# Patient Record
Sex: Female | Born: 1955 | Race: White | Hispanic: No | Smoking: Never smoker
Health system: Southern US, Community
[De-identification: ages and names within clinical notes are randomized; demographics above are authoritative.]

## PROBLEM LIST (undated history)

## (undated) DIAGNOSIS — F209 Schizophrenia, unspecified: Secondary | ICD-10-CM

## (undated) DIAGNOSIS — W57XXXA Bitten or stung by nonvenomous insect and other nonvenomous arthropods, initial encounter: Secondary | ICD-10-CM

## (undated) DIAGNOSIS — R51 Headache: Secondary | ICD-10-CM

## (undated) DIAGNOSIS — K219 Gastro-esophageal reflux disease without esophagitis: Secondary | ICD-10-CM

## (undated) DIAGNOSIS — E282 Polycystic ovarian syndrome: Secondary | ICD-10-CM

## (undated) DIAGNOSIS — F329 Major depressive disorder, single episode, unspecified: Secondary | ICD-10-CM

## (undated) DIAGNOSIS — R519 Headache, unspecified: Secondary | ICD-10-CM

## (undated) DIAGNOSIS — F32A Depression, unspecified: Secondary | ICD-10-CM

## (undated) DIAGNOSIS — Z800 Family history of malignant neoplasm of digestive organs: Secondary | ICD-10-CM

## (undated) DIAGNOSIS — J45909 Unspecified asthma, uncomplicated: Secondary | ICD-10-CM

## (undated) DIAGNOSIS — M1990 Unspecified osteoarthritis, unspecified site: Secondary | ICD-10-CM

## (undated) HISTORY — DX: Family history of malignant neoplasm of digestive organs: Z80.0

## (undated) HISTORY — PX: DILATION AND CURETTAGE OF UTERUS: SHX78

## (undated) HISTORY — PX: NOSE SURGERY: SHX723

---

## 1984-12-30 HISTORY — PX: OVARIAN CYST REMOVAL: SHX89

## 1985-12-30 HISTORY — PX: DIAGNOSTIC LAPAROSCOPY: SUR761

## 2006-03-22 ENCOUNTER — Emergency Department: Payer: Self-pay | Admitting: Emergency Medicine

## 2006-09-18 ENCOUNTER — Ambulatory Visit: Payer: Self-pay | Admitting: Internal Medicine

## 2006-11-04 ENCOUNTER — Ambulatory Visit: Payer: Self-pay | Admitting: Internal Medicine

## 2008-06-20 ENCOUNTER — Ambulatory Visit: Payer: Self-pay | Admitting: Internal Medicine

## 2008-08-25 ENCOUNTER — Ambulatory Visit: Payer: Self-pay

## 2008-09-06 ENCOUNTER — Ambulatory Visit: Payer: Self-pay

## 2008-09-06 LAB — HM MAMMOGRAPHY

## 2009-01-05 ENCOUNTER — Ambulatory Visit: Payer: Self-pay | Admitting: Unknown Physician Specialty

## 2009-09-07 ENCOUNTER — Ambulatory Visit: Payer: Self-pay | Admitting: Internal Medicine

## 2009-09-08 ENCOUNTER — Ambulatory Visit: Payer: Self-pay | Admitting: Internal Medicine

## 2010-06-04 ENCOUNTER — Ambulatory Visit: Payer: Self-pay | Admitting: Internal Medicine

## 2010-06-26 ENCOUNTER — Ambulatory Visit: Payer: Self-pay | Admitting: Surgery

## 2010-08-29 ENCOUNTER — Ambulatory Visit: Payer: Self-pay | Admitting: Unknown Physician Specialty

## 2010-08-31 LAB — PATHOLOGY REPORT

## 2010-11-15 ENCOUNTER — Ambulatory Visit: Payer: Self-pay | Admitting: Internal Medicine

## 2011-01-07 ENCOUNTER — Emergency Department: Payer: Self-pay | Admitting: Emergency Medicine

## 2011-01-15 ENCOUNTER — Emergency Department: Payer: Self-pay | Admitting: Emergency Medicine

## 2012-02-18 ENCOUNTER — Ambulatory Visit: Payer: Self-pay | Admitting: Internal Medicine

## 2013-05-13 ENCOUNTER — Ambulatory Visit: Payer: Self-pay | Admitting: Unknown Physician Specialty

## 2013-05-14 LAB — PATHOLOGY REPORT

## 2013-07-22 ENCOUNTER — Ambulatory Visit: Payer: Self-pay | Admitting: Internal Medicine

## 2014-08-22 DIAGNOSIS — E669 Obesity, unspecified: Secondary | ICD-10-CM | POA: Insufficient documentation

## 2014-08-22 DIAGNOSIS — F250 Schizoaffective disorder, bipolar type: Secondary | ICD-10-CM | POA: Insufficient documentation

## 2014-08-22 DIAGNOSIS — E282 Polycystic ovarian syndrome: Secondary | ICD-10-CM | POA: Insufficient documentation

## 2014-09-22 ENCOUNTER — Ambulatory Visit: Payer: Self-pay | Admitting: Internal Medicine

## 2015-01-16 DIAGNOSIS — F209 Schizophrenia, unspecified: Secondary | ICD-10-CM | POA: Diagnosis not present

## 2015-02-15 DIAGNOSIS — Z01419 Encounter for gynecological examination (general) (routine) without abnormal findings: Secondary | ICD-10-CM | POA: Diagnosis not present

## 2015-02-15 LAB — HM PAP SMEAR: HM Pap smear: NEGATIVE

## 2015-02-28 DIAGNOSIS — F209 Schizophrenia, unspecified: Secondary | ICD-10-CM | POA: Diagnosis not present

## 2015-06-23 DIAGNOSIS — Z0100 Encounter for examination of eyes and vision without abnormal findings: Secondary | ICD-10-CM | POA: Diagnosis not present

## 2015-06-27 DIAGNOSIS — F209 Schizophrenia, unspecified: Secondary | ICD-10-CM | POA: Diagnosis not present

## 2015-07-25 DIAGNOSIS — F209 Schizophrenia, unspecified: Secondary | ICD-10-CM | POA: Diagnosis not present

## 2015-07-26 ENCOUNTER — Encounter: Payer: Self-pay | Admitting: *Deleted

## 2015-07-26 ENCOUNTER — Emergency Department
Admission: EM | Admit: 2015-07-26 | Discharge: 2015-07-26 | Disposition: A | Discharge status: Home / Self Care | Payer: Commercial Managed Care - HMO | Attending: Emergency Medicine | Admitting: Emergency Medicine

## 2015-07-26 DIAGNOSIS — T887XXA Unspecified adverse effect of drug or medicament, initial encounter: Secondary | ICD-10-CM | POA: Diagnosis not present

## 2015-07-26 DIAGNOSIS — T50905A Adverse effect of unspecified drugs, medicaments and biological substances, initial encounter: Secondary | ICD-10-CM

## 2015-07-26 DIAGNOSIS — T421X5A Adverse effect of iminostilbenes, initial encounter: Secondary | ICD-10-CM | POA: Insufficient documentation

## 2015-07-26 DIAGNOSIS — R220 Localized swelling, mass and lump, head: Secondary | ICD-10-CM | POA: Diagnosis not present

## 2015-07-26 HISTORY — DX: Depression, unspecified: F32.A

## 2015-07-26 HISTORY — DX: Schizophrenia, unspecified: F20.9

## 2015-07-26 HISTORY — DX: Major depressive disorder, single episode, unspecified: F32.9

## 2015-07-26 MED ORDER — PREDNISONE 50 MG PO TABS: ORAL_TABLET | ORAL | Status: DC

## 2015-07-26 MED ORDER — PREDNISONE 20 MG PO TABS
60.0000 mg | ORAL_TABLET | Freq: Once | ORAL | Status: AC
  Administered 2015-07-26: 60 mg via ORAL
  Filled 2015-07-26: qty 3

## 2015-07-26 NOTE — ED Provider Notes (Signed)
[facility] Emergency Department Provider Note     Time seen: ----------------------------------------- 5:15 PM on 07/26/2015 -----------------------------------------    I have reviewed the triage vital signs and the nursing notes.   HISTORY  Chief Complaint Allergic Reaction    HPI Samantha Dean is a 59 y.o. female who presents to ER with tongue swelling and lip swelling. Patient states today she feels a burning sensation in the back for. She was recently started on Trileptal, has a history of depression and schizophrenia. Currently the symptoms are mild, nothing makes it better or worse. She was told by the nurse for her psychiatrist come to the ER for evaluation   Past Medical History  Diagnosis Date  . Depression   . Schizophrenia     There are no active problems to display for this patient.   Past Surgical History  Procedure Laterality Date  . Cesarean section      Allergies Sulfa antibiotics  Social History History  Substance Use Topics  . Smoking status: Never Smoker   . Smokeless tobacco: Not on file  . Alcohol Use: No    Review of Systems Constitutional: Negative for fever. Eyes: Negative for visual changes. ENT: Positive for throat, tongue, lip swelling or burning Cardiovascular: Negative for chest pain. Respiratory: Negative for shortness of breath. Gastrointestinal: Negative for abdominal pain, vomiting and diarrhea. Genitourinary: Negative for dysuria. Musculoskeletal: Negative for back pain. Skin: Negative for rash. Neurological: Negative for headaches, focal weakness or numbness.  10-point ROS otherwise negative.  ____________________________________________   PHYSICAL EXAM:  VITAL SIGNS: ED Triage Vitals  Enc Vitals Group     BP 07/26/15 1659 126/76 mmHg     Pulse Rate 07/26/15 1659 89     Resp 07/26/15 1659 16     Temp 07/26/15 1659 98.2 F (36.8 C)     Temp Source 07/26/15 1659 Oral     SpO2  07/26/15 1659 98 %     Weight 07/26/15 1659 168 lb (76.204 kg)     Height 07/26/15 1659 5' (1.524 m)     Head Cir --      Peak Flow --      Pain Score 07/26/15 1705 8     Pain Loc --      Pain Edu? --      Excl. in [place]? --     Constitutional: Alert and oriented. Well appearing and in no distress. Eyes: Conjunctivae are normal. PERRL. Normal extraocular movements. ENT   Head: Normocephalic and atraumatic.   Nose: No congestion/rhinnorhea.   Mouth/Throat: Mucous membranes are moist.   Neck: No stridor. Hematological/Lymphatic/Immunilogical: No cervical lymphadenopathy. Cardiovascular: Normal rate, regular rhythm. Normal and symmetric distal pulses are present in all extremities. No murmurs, rubs, or gallops. Respiratory: Normal respiratory effort without tachypnea nor retractions. Breath sounds are clear and equal bilaterally. No wheezes/rales/rhonchi. Gastrointestinal: Soft and nontender. No distention. No abdominal bruits. There is no CVA tenderness. Musculoskeletal: Nontender with normal range of motion in all extremities. No joint effusions.  No lower extremity tenderness nor edema. Neurologic:  Normal speech and language. No gross focal neurologic deficits are appreciated. Speech is normal. No gait instability. Skin:  Skin is warm, dry and intact. No rash noted. Psychiatric: Patient exhibits appropriate insight and judgment.  ____________________________________________  ED COURSE:  Pertinent labs & imaging results that were available during my care of the patient were reviewed by me and considered in my medical decision making (see chart for details). Patient looks well, will  give oral prednisone. She is stable for outpatient follow-up area and advised stopping the Trileptal ____________________________________________    ____________________________________________  FINAL ASSESSMENT AND PLAN  Possible medication reaction  Plan: Patient with labs and imaging  as dictated above. Patient looks well, no visible signs of swelling or lesions noted. She stable for discharge. Will continue prednisone for several days.   Earleen Newport, MD   Earleen Newport, MD 07/26/15 [phone]

## 2015-07-26 NOTE — ED Notes (Signed)
Pt reports that 1.5 days ago she had tongue swelling and bottom lip swelling. Today, she feels a burning sensation in the back of her throat. Pt in no acute distress in triage.

## 2015-07-26 NOTE — Discharge Instructions (Signed)
Drug Allergy °A drug allergy means you have a strange reaction to a medicine. You may have puffiness (swelling), itching, red rashes, and hives. Some allergic reactions can be life-threatening. °HOME CARE  °If you do not know what caused your reaction: °· Write down medicines you use. °· Write down any problems you have after using medicine. °· Avoid things that cause a reaction. °· You can see an allergy doctor to be tested for allergies. °If you have hives or a rash: °· Take medicine as told by your doctor. °· Place cold cloths on your skin. °· Do not take hot baths or hot showers. Take baths in cool water. °If you are severely allergic: °· Wear a medical bracelet or necklace that lists your allergy. °· Carry your allergy kit or medicine shot to treat severe allergic reactions with you. These can save your life. °· Do not drive until medicine from your shot has worn off, unless your doctor says it is okay. °GET HELP RIGHT AWAY IF:  °· Your mouth is puffy, or you have trouble breathing. °· You have a tight feeling in your chest or throat. °· You have hives, puffiness, or itching all over your body. °· You throw up (vomit) or have watery poop (diarrhea). °· You feel dizzy or pass out (faint). °· You think you are having a reaction. Problems often start within 30 minutes after taking a medicine. °· You are getting worse, not better. °· You have new problems. °· Your problems go away and then come back. °This is an emergency. Use your medicine shot or allergy kit as told. Call your local emergency services (911 in [place] after the shot. Even if you feel better after the shot, you need to go to the hospital. You may need more medicine to control a severe reaction. °MAKE SURE YOU: °· Understand these instructions. °· Will watch your condition. °· Will get help right away if you are not doing well or get worse. °Document Released: 01/23/2005 Document Revised: 03/09/2012 Document Reviewed: 06/13/2011 °ExitCare® Patient  Information ©2015 [employer]. This information is not intended to replace advice given to you by your health care provider. Make sure you discuss any questions you have with your health care provider. ° °

## 2015-07-31 DIAGNOSIS — J309 Allergic rhinitis, unspecified: Secondary | ICD-10-CM | POA: Diagnosis not present

## 2015-07-31 DIAGNOSIS — J029 Acute pharyngitis, unspecified: Secondary | ICD-10-CM | POA: Diagnosis not present

## 2015-08-21 DIAGNOSIS — J019 Acute sinusitis, unspecified: Secondary | ICD-10-CM | POA: Diagnosis not present

## 2015-08-21 DIAGNOSIS — B9689 Other specified bacterial agents as the cause of diseases classified elsewhere: Secondary | ICD-10-CM | POA: Diagnosis not present

## 2015-09-08 DIAGNOSIS — J029 Acute pharyngitis, unspecified: Secondary | ICD-10-CM | POA: Diagnosis not present

## 2015-09-14 DIAGNOSIS — J312 Chronic pharyngitis: Secondary | ICD-10-CM | POA: Diagnosis not present

## 2015-09-27 DIAGNOSIS — J387 Other diseases of larynx: Secondary | ICD-10-CM | POA: Diagnosis not present

## 2015-09-27 DIAGNOSIS — K121 Other forms of stomatitis: Secondary | ICD-10-CM | POA: Diagnosis not present

## 2015-09-27 DIAGNOSIS — R0982 Postnasal drip: Secondary | ICD-10-CM | POA: Diagnosis not present

## 2015-10-25 DIAGNOSIS — Z79899 Other long term (current) drug therapy: Secondary | ICD-10-CM | POA: Diagnosis not present

## 2015-10-25 DIAGNOSIS — Z0001 Encounter for general adult medical examination with abnormal findings: Secondary | ICD-10-CM | POA: Diagnosis not present

## 2015-10-27 DIAGNOSIS — N9089 Other specified noninflammatory disorders of vulva and perineum: Secondary | ICD-10-CM | POA: Diagnosis not present

## 2015-11-03 ENCOUNTER — Other Ambulatory Visit: Payer: Self-pay | Admitting: Internal Medicine

## 2015-11-03 DIAGNOSIS — Z1231 Encounter for screening mammogram for malignant neoplasm of breast: Secondary | ICD-10-CM

## 2015-11-06 DIAGNOSIS — F209 Schizophrenia, unspecified: Secondary | ICD-10-CM | POA: Diagnosis not present

## 2015-11-08 ENCOUNTER — Other Ambulatory Visit: Payer: Self-pay | Admitting: Internal Medicine

## 2015-11-08 ENCOUNTER — Ambulatory Visit
Admission: RE | Admit: 2015-11-08 | Discharge: 2015-11-08 | Disposition: A | Discharge status: Home / Self Care | Payer: Commercial Managed Care - HMO | Source: Ambulatory Visit | Attending: Internal Medicine | Admitting: Internal Medicine

## 2015-11-08 DIAGNOSIS — Z1231 Encounter for screening mammogram for malignant neoplasm of breast: Secondary | ICD-10-CM

## 2015-11-30 DIAGNOSIS — L821 Other seborrheic keratosis: Secondary | ICD-10-CM | POA: Diagnosis not present

## 2015-11-30 DIAGNOSIS — L708 Other acne: Secondary | ICD-10-CM | POA: Diagnosis not present

## 2015-11-30 DIAGNOSIS — D1801 Hemangioma of skin and subcutaneous tissue: Secondary | ICD-10-CM | POA: Diagnosis not present

## 2016-01-26 DIAGNOSIS — B9689 Other specified bacterial agents as the cause of diseases classified elsewhere: Secondary | ICD-10-CM | POA: Diagnosis not present

## 2016-01-26 DIAGNOSIS — J019 Acute sinusitis, unspecified: Secondary | ICD-10-CM | POA: Diagnosis not present

## 2016-01-29 DIAGNOSIS — J019 Acute sinusitis, unspecified: Secondary | ICD-10-CM | POA: Diagnosis not present

## 2016-01-29 DIAGNOSIS — F209 Schizophrenia, unspecified: Secondary | ICD-10-CM | POA: Diagnosis not present

## 2016-01-29 DIAGNOSIS — R51 Headache: Secondary | ICD-10-CM | POA: Diagnosis not present

## 2016-02-09 DIAGNOSIS — J209 Acute bronchitis, unspecified: Secondary | ICD-10-CM | POA: Diagnosis not present

## 2016-02-26 DIAGNOSIS — J029 Acute pharyngitis, unspecified: Secondary | ICD-10-CM | POA: Diagnosis not present

## 2016-02-26 DIAGNOSIS — R05 Cough: Secondary | ICD-10-CM | POA: Diagnosis not present

## 2016-04-08 DIAGNOSIS — M25561 Pain in right knee: Secondary | ICD-10-CM | POA: Diagnosis not present

## 2016-04-15 DIAGNOSIS — F209 Schizophrenia, unspecified: Secondary | ICD-10-CM | POA: Diagnosis not present

## 2016-04-16 DIAGNOSIS — R600 Localized edema: Secondary | ICD-10-CM | POA: Diagnosis not present

## 2016-05-03 DIAGNOSIS — S86811A Strain of other muscle(s) and tendon(s) at lower leg level, right leg, initial encounter: Secondary | ICD-10-CM | POA: Diagnosis not present

## 2016-05-14 ENCOUNTER — Encounter: Payer: Self-pay | Admitting: *Deleted

## 2016-05-15 ENCOUNTER — Ambulatory Visit
Admission: RE | Admit: 2016-05-15 | Discharge: 2016-05-15 | Disposition: A | Discharge status: Home / Self Care | Payer: Commercial Managed Care - HMO | Source: Ambulatory Visit | Attending: Unknown Physician Specialty | Admitting: Unknown Physician Specialty

## 2016-05-15 ENCOUNTER — Encounter
Admission: RE | Disposition: A | Discharge status: Home / Self Care | Payer: Self-pay | Source: Ambulatory Visit | Attending: Unknown Physician Specialty

## 2016-05-15 ENCOUNTER — Encounter: Payer: Self-pay | Admitting: Anesthesiology

## 2016-05-15 ENCOUNTER — Ambulatory Visit: Payer: Commercial Managed Care - HMO | Admitting: Anesthesiology

## 2016-05-15 DIAGNOSIS — K3189 Other diseases of stomach and duodenum: Secondary | ICD-10-CM | POA: Diagnosis not present

## 2016-05-15 DIAGNOSIS — Z8719 Personal history of other diseases of the digestive system: Secondary | ICD-10-CM | POA: Diagnosis not present

## 2016-05-15 DIAGNOSIS — Z882 Allergy status to sulfonamides status: Secondary | ICD-10-CM | POA: Diagnosis not present

## 2016-05-15 DIAGNOSIS — F329 Major depressive disorder, single episode, unspecified: Secondary | ICD-10-CM | POA: Diagnosis not present

## 2016-05-15 DIAGNOSIS — Z79899 Other long term (current) drug therapy: Secondary | ICD-10-CM | POA: Diagnosis not present

## 2016-05-15 DIAGNOSIS — K2960 Other gastritis without bleeding: Secondary | ICD-10-CM | POA: Diagnosis not present

## 2016-05-15 DIAGNOSIS — F209 Schizophrenia, unspecified: Secondary | ICD-10-CM | POA: Diagnosis not present

## 2016-05-15 DIAGNOSIS — K209 Esophagitis, unspecified: Secondary | ICD-10-CM | POA: Diagnosis not present

## 2016-05-15 DIAGNOSIS — K2970 Gastritis, unspecified, without bleeding: Secondary | ICD-10-CM | POA: Diagnosis not present

## 2016-05-15 DIAGNOSIS — K210 Gastro-esophageal reflux disease with esophagitis: Secondary | ICD-10-CM | POA: Insufficient documentation

## 2016-05-15 DIAGNOSIS — K319 Disease of stomach and duodenum, unspecified: Secondary | ICD-10-CM | POA: Diagnosis not present

## 2016-05-15 DIAGNOSIS — K2270 Barrett's esophagus without dysplasia: Secondary | ICD-10-CM | POA: Diagnosis present

## 2016-05-15 HISTORY — PX: ESOPHAGOGASTRODUODENOSCOPY (EGD) WITH PROPOFOL: SHX5813

## 2016-05-15 HISTORY — DX: Polycystic ovarian syndrome: E28.2

## 2016-05-15 SURGERY — ESOPHAGOGASTRODUODENOSCOPY (EGD) WITH PROPOFOL
Anesthesia: General

## 2016-05-15 MED ORDER — LIDOCAINE HCL (PF) 1 % IJ SOLN
2.0000 mL | Freq: Once | INTRAMUSCULAR | Status: AC
  Administered 2016-05-15: 0.03 mL via INTRADERMAL
  Filled 2016-05-15: qty 2

## 2016-05-15 MED ORDER — MIDAZOLAM HCL 2 MG/2ML IJ SOLN
INTRAMUSCULAR | Status: DC | PRN
  Administered 2016-05-15: 1 mg via INTRAVENOUS

## 2016-05-15 MED ORDER — FENTANYL CITRATE (PF) 100 MCG/2ML IJ SOLN
INTRAMUSCULAR | Status: DC | PRN
  Administered 2016-05-15: 50 ug via INTRAVENOUS

## 2016-05-15 MED ORDER — SODIUM CHLORIDE 0.9 % IV SOLN
INTRAVENOUS | Status: DC
  Administered 2016-05-15: 1000 mL via INTRAVENOUS

## 2016-05-15 MED ORDER — SODIUM CHLORIDE 0.9 % IV SOLN: INTRAVENOUS | Status: DC

## 2016-05-15 MED ORDER — PROPOFOL 500 MG/50ML IV EMUL
INTRAVENOUS | Status: DC | PRN
  Administered 2016-05-15: 120 ug/kg/min via INTRAVENOUS

## 2016-05-15 NOTE — Op Note (Signed)
[facility] Gastroenterology Patient Name: Samantha Dean Procedure Date: 05/15/2016 8:47 AM MRN: [id number] Account #: [id number] Date of Birth: Sep 19, 1956 Admit Type: Outpatient Age: 60 Room: [facility] ENDO ROOM 4 Gender: Female Note Status: Finalized Procedure:            Upper GI endoscopy Indications:          Follow-up of Barrett's esophagus Providers:            Manya Silvas, MD Referring MD:         Hewitt Blade. Sarina Ser, MD (Referring MD) Medicines:            Propofol per Anesthesia Complications:        No immediate complications. Procedure:            Pre-Anesthesia Assessment:                       - After reviewing the risks and benefits, the patient                        was deemed in satisfactory condition to undergo the                        procedure.                       After obtaining informed consent, the endoscope was                        passed under direct vision. Throughout the procedure,                        the patient's blood pressure, pulse, and oxygen                        saturations were monitored continuously. The Endoscope                        was introduced through the mouth, and advanced to the                        second part of duodenum. The upper GI endoscopy was                        accomplished without difficulty. The patient tolerated                        the procedure well. Findings:      LA Grade B (one or more mucosal breaks greater than 5 mm, not extending       between the tops of two mucosal folds) esophagitis with no bleeding was       found 37 cm from the incisors. Biopsies were taken with a cold forceps       for histology. Esophagitis runs from 37 to 39cm.      Patchy mild inflammation characterized by erythema and granularity was       found in the gastric body and in the gastric antrum. Biopsies were taken       with a cold forceps for histology. Biopsies were taken with a cold       forceps for  Helicobacter pylori testing.  The examined duodenum was normal. Impression:           - LA Grade B reflux esophagitis. Rule out Barrett's                        esophagus. Biopsied.                       - Gastritis. Biopsied.                       - Normal examined duodenum. Recommendation:       - Await pathology results. Take medicine to shut off                        acid in the stomach. Manya Silvas, MD 05/15/2016 9:03:44 AM This report has been signed electronically. Number of Addenda: 0 Note Initiated On: 05/15/2016 8:47 AM      [facility]

## 2016-05-15 NOTE — Anesthesia Postprocedure Evaluation (Signed)
Anesthesia Post Note  Patient: Samantha Dean  Procedure(s) Performed: Procedure(s) (LRB): ESOPHAGOGASTRODUODENOSCOPY (EGD) WITH PROPOFOL (N/A)  Patient location during evaluation: Endoscopy Anesthesia Type: General Level of consciousness: awake and alert Pain management: pain level controlled Vital Signs Assessment: post-procedure vital signs reviewed and stable Respiratory status: spontaneous breathing, nonlabored ventilation, respiratory function stable and patient connected to nasal cannula oxygen Cardiovascular status: blood pressure returned to baseline and stable Postop Assessment: no signs of nausea or vomiting Anesthetic complications: no    Last Vitals:  Filed Vitals:   05/15/16 0920 05/15/16 0930  BP: 118/81 123/75  Pulse: 89 85  Temp:    Resp: 17 17    Last Pain: There were no vitals filed for this visit.               Birdena Kingma S

## 2016-05-15 NOTE — Anesthesia Preprocedure Evaluation (Signed)
Anesthesia Evaluation  Patient identified by MRN, date of birth, ID band Patient awake    Reviewed: Allergy & Precautions, NPO status , Patient's Chart, lab work & pertinent test results, reviewed documented beta blocker date and time   Airway Mallampati: II  TM Distance: >3 FB     Dental  (+) Chipped   Pulmonary           Cardiovascular      Neuro/Psych PSYCHIATRIC DISORDERS Depression Schizophrenia    GI/Hepatic   Endo/Other    Renal/GU      Musculoskeletal   Abdominal   Peds  Hematology   Anesthesia Other Findings   Reproductive/Obstetrics                             Anesthesia Physical Anesthesia Plan  ASA: III  Anesthesia Plan: General   Post-op Pain Management:    Induction: Intravenous  Airway Management Planned: Nasal Cannula  Additional Equipment:   Intra-op Plan:   Post-operative Plan:   Informed Consent: I have reviewed the patients History and Physical, chart, labs and discussed the procedure including the risks, benefits and alternatives for the proposed anesthesia with the patient or authorized representative who has indicated his/her understanding and acceptance.     Plan Discussed with: CRNA  Anesthesia Plan Comments:         Anesthesia Quick Evaluation

## 2016-05-15 NOTE — Anesthesia Procedure Notes (Signed)
Performed by: COOK-MARTIN, Emberlie Gotcher Pre-anesthesia Checklist: Patient identified, Suction available, Emergency Drugs available, Patient being monitored and Timeout performed Patient Re-evaluated:Patient Re-evaluated prior to inductionOxygen Delivery Method: Nasal cannula Preoxygenation: Pre-oxygenation with 100% oxygen Intubation Type: IV induction Airway Equipment and Method: Bite block Placement Confirmation: positive ETCO2 and CO2 detector     

## 2016-05-15 NOTE — Transfer of Care (Signed)
Immediate Anesthesia Transfer of Care Note  Patient: Samantha Dean  Procedure(s) Performed: Procedure(s): ESOPHAGOGASTRODUODENOSCOPY (EGD) WITH PROPOFOL (N/A)  Patient Location: PACU  Anesthesia Type:General  Level of Consciousness: awake, alert  and sedated  Airway & Oxygen Therapy: Patient Spontanous Breathing and Patient connected to nasal cannula oxygen  Post-op Assessment: Report given to RN and Post -op Vital signs reviewed and stable  Post vital signs: Reviewed and stable  Last Vitals:  Filed Vitals:   05/15/16 0821  BP: 136/61  Pulse: 70  Temp: 35.9 C  Resp: 17    Last Pain: There were no vitals filed for this visit.       Complications: No apparent anesthesia complications

## 2016-05-15 NOTE — H&P (Signed)
   Primary Care Physician:  Madelyn Brunner, MD Primary Gastroenterologist:  Dr. Vira Agar  Pre-Procedure History & Physical: HPI:  Samantha Dean is a 60 y.o. female is here for an endoscopy.   Past Medical History  Diagnosis Date  . Depression   . Schizophrenia ([place])   . Polycystic ovarian syndrome     Past Surgical History  Procedure Laterality Date  . Cesarean section      Prior to Admission medications   Medication Sig Start Date End Date Taking? Authorizing Provider  Biotin 1 MG CAPS Take by mouth.   Yes Historical Provider, MD  escitalopram (LEXAPRO) 10 MG tablet Take 10 mg by mouth daily.   Yes Historical Provider, MD  perphenazine (TRILAFON) 8 MG tablet Take 8 mg by mouth 2 (two) times daily.   Yes Historical Provider, MD  predniSONE (DELTASONE) 50 MG tablet Take one tablet by mouth daily 07/26/15   Earleen Newport, MD    Allergies as of 05/06/2016 - never reviewed  Allergen Reaction Noted  . Sulfa antibiotics Hives 07/26/2015    History reviewed. No pertinent family history.  Social History   Social History  . Marital Status: Divorced    Spouse Name: N/A  . Number of Children: N/A  . Years of Education: N/A   Occupational History  . Not on file.   Social History Main Topics  . Smoking status: Never Smoker   . Smokeless tobacco: Not on file  . Alcohol Use: No  . Drug Use: No  . Sexual Activity: Not on file   Other Topics Concern  . Not on file   Social History Narrative    Review of Systems: See HPI, otherwise negative ROS  Physical Exam: BP 136/61 mmHg  Pulse 70  Temp(Src) 96.7 F (35.9 C) (Tympanic)  Resp 17  Ht 5' (1.524 m)  Wt 87.998 kg (194 lb)  BMI 37.89 kg/m2  SpO2 96% General:   Alert,  pleasant and cooperative in NAD Head:  Normocephalic and atraumatic. Neck:  Supple; no masses or thyromegaly. Lungs:  Clear throughout to auscultation.    Heart:  Regular rate and rhythm. Abdomen:  Soft, nontender and nondistended. Normal  bowel sounds, without guarding, and without rebound.   Neurologic:  Alert and  oriented x4;  grossly normal neurologically.  Impression/Plan: Samantha Dean is here for an endoscopy to be performed for F/U Barretts esophagus  Risks, benefits, limitations, and alternatives regarding  endoscopy have been reviewed with the patient.  Questions have been answered.  All parties agreeable.   Gaylyn Cheers, MD  05/15/2016, 8:45 AM

## 2016-05-16 ENCOUNTER — Encounter: Payer: Self-pay | Admitting: Unknown Physician Specialty

## 2016-05-16 LAB — SURGICAL PATHOLOGY

## 2016-05-22 DIAGNOSIS — M2391 Unspecified internal derangement of right knee: Secondary | ICD-10-CM | POA: Diagnosis not present

## 2016-05-22 DIAGNOSIS — S86811D Strain of other muscle(s) and tendon(s) at lower leg level, right leg, subsequent encounter: Secondary | ICD-10-CM | POA: Diagnosis not present

## 2016-05-29 DIAGNOSIS — M25561 Pain in right knee: Secondary | ICD-10-CM | POA: Diagnosis not present

## 2016-06-03 DIAGNOSIS — M25561 Pain in right knee: Secondary | ICD-10-CM | POA: Diagnosis not present

## 2016-06-06 DIAGNOSIS — M25561 Pain in right knee: Secondary | ICD-10-CM | POA: Diagnosis not present

## 2016-06-10 DIAGNOSIS — M25561 Pain in right knee: Secondary | ICD-10-CM | POA: Diagnosis not present

## 2016-06-13 DIAGNOSIS — M25561 Pain in right knee: Secondary | ICD-10-CM | POA: Diagnosis not present

## 2016-06-17 ENCOUNTER — Other Ambulatory Visit: Payer: Self-pay | Admitting: Orthopedic Surgery

## 2016-06-17 DIAGNOSIS — G8929 Other chronic pain: Secondary | ICD-10-CM

## 2016-06-17 DIAGNOSIS — M25561 Pain in right knee: Secondary | ICD-10-CM

## 2016-06-17 DIAGNOSIS — M2391 Unspecified internal derangement of right knee: Secondary | ICD-10-CM | POA: Diagnosis not present

## 2016-06-20 ENCOUNTER — Ambulatory Visit (HOSPITAL_COMMUNITY)
Admission: RE | Admit: 2016-06-20 | Discharge: 2016-06-20 | Disposition: A | Discharge status: Home / Self Care | Payer: Commercial Managed Care - HMO | Source: Ambulatory Visit | Attending: Orthopedic Surgery | Admitting: Orthopedic Surgery

## 2016-06-20 DIAGNOSIS — M23321 Other meniscus derangements, posterior horn of medial meniscus, right knee: Secondary | ICD-10-CM | POA: Insufficient documentation

## 2016-06-20 DIAGNOSIS — R938 Abnormal findings on diagnostic imaging of other specified body structures: Secondary | ICD-10-CM | POA: Diagnosis not present

## 2016-06-20 DIAGNOSIS — M25561 Pain in right knee: Secondary | ICD-10-CM | POA: Insufficient documentation

## 2016-06-20 DIAGNOSIS — M25461 Effusion, right knee: Secondary | ICD-10-CM | POA: Insufficient documentation

## 2016-06-20 DIAGNOSIS — M7051 Other bursitis of knee, right knee: Secondary | ICD-10-CM | POA: Insufficient documentation

## 2016-06-20 DIAGNOSIS — S83241A Other tear of medial meniscus, current injury, right knee, initial encounter: Secondary | ICD-10-CM | POA: Diagnosis not present

## 2016-06-20 DIAGNOSIS — M2391 Unspecified internal derangement of right knee: Secondary | ICD-10-CM

## 2016-06-20 DIAGNOSIS — G8929 Other chronic pain: Secondary | ICD-10-CM | POA: Diagnosis not present

## 2016-06-25 DIAGNOSIS — F209 Schizophrenia, unspecified: Secondary | ICD-10-CM | POA: Diagnosis not present

## 2016-06-27 DIAGNOSIS — R5381 Other malaise: Secondary | ICD-10-CM | POA: Diagnosis not present

## 2016-06-27 DIAGNOSIS — R5383 Other fatigue: Secondary | ICD-10-CM | POA: Diagnosis not present

## 2016-06-27 DIAGNOSIS — R630 Anorexia: Secondary | ICD-10-CM | POA: Diagnosis not present

## 2016-06-27 DIAGNOSIS — E876 Hypokalemia: Secondary | ICD-10-CM | POA: Diagnosis not present

## 2016-06-27 DIAGNOSIS — R6883 Chills (without fever): Secondary | ICD-10-CM | POA: Diagnosis not present

## 2016-06-28 DIAGNOSIS — S83242A Other tear of medial meniscus, current injury, left knee, initial encounter: Secondary | ICD-10-CM | POA: Insufficient documentation

## 2016-06-28 DIAGNOSIS — M1712 Unilateral primary osteoarthritis, left knee: Secondary | ICD-10-CM | POA: Insufficient documentation

## 2016-06-28 DIAGNOSIS — M1711 Unilateral primary osteoarthritis, right knee: Secondary | ICD-10-CM | POA: Diagnosis not present

## 2016-06-28 DIAGNOSIS — S83231A Complex tear of medial meniscus, current injury, right knee, initial encounter: Secondary | ICD-10-CM | POA: Diagnosis not present

## 2016-07-11 ENCOUNTER — Encounter: Payer: Self-pay | Admitting: *Deleted

## 2016-07-17 ENCOUNTER — Encounter
Admission: RE | Disposition: A | Discharge status: Home / Self Care | Payer: Self-pay | Source: Ambulatory Visit | Attending: Surgery

## 2016-07-17 ENCOUNTER — Ambulatory Visit: Payer: Commercial Managed Care - HMO | Admitting: Student in an Organized Health Care Education/Training Program

## 2016-07-17 ENCOUNTER — Ambulatory Visit
Admission: RE | Admit: 2016-07-17 | Discharge: 2016-07-17 | Disposition: A | Discharge status: Home / Self Care | Payer: Commercial Managed Care - HMO | Source: Ambulatory Visit | Attending: Surgery | Admitting: Surgery

## 2016-07-17 DIAGNOSIS — Z888 Allergy status to other drugs, medicaments and biological substances status: Secondary | ICD-10-CM | POA: Diagnosis not present

## 2016-07-17 DIAGNOSIS — S83206A Unspecified tear of unspecified meniscus, current injury, right knee, initial encounter: Secondary | ICD-10-CM | POA: Diagnosis not present

## 2016-07-17 DIAGNOSIS — Z833 Family history of diabetes mellitus: Secondary | ICD-10-CM | POA: Diagnosis not present

## 2016-07-17 DIAGNOSIS — Z882 Allergy status to sulfonamides status: Secondary | ICD-10-CM | POA: Diagnosis not present

## 2016-07-17 DIAGNOSIS — Z79899 Other long term (current) drug therapy: Secondary | ICD-10-CM | POA: Insufficient documentation

## 2016-07-17 DIAGNOSIS — Z6838 Body mass index (BMI) 38.0-38.9, adult: Secondary | ICD-10-CM | POA: Diagnosis not present

## 2016-07-17 DIAGNOSIS — Z91011 Allergy to milk products: Secondary | ICD-10-CM | POA: Insufficient documentation

## 2016-07-17 DIAGNOSIS — Z8249 Family history of ischemic heart disease and other diseases of the circulatory system: Secondary | ICD-10-CM | POA: Insufficient documentation

## 2016-07-17 DIAGNOSIS — Z803 Family history of malignant neoplasm of breast: Secondary | ICD-10-CM | POA: Diagnosis not present

## 2016-07-17 DIAGNOSIS — K219 Gastro-esophageal reflux disease without esophagitis: Secondary | ICD-10-CM | POA: Insufficient documentation

## 2016-07-17 DIAGNOSIS — E669 Obesity, unspecified: Secondary | ICD-10-CM | POA: Insufficient documentation

## 2016-07-17 DIAGNOSIS — M1731 Unilateral post-traumatic osteoarthritis, right knee: Secondary | ICD-10-CM | POA: Diagnosis not present

## 2016-07-17 DIAGNOSIS — M1711 Unilateral primary osteoarthritis, right knee: Secondary | ICD-10-CM | POA: Insufficient documentation

## 2016-07-17 DIAGNOSIS — Z91040 Latex allergy status: Secondary | ICD-10-CM | POA: Insufficient documentation

## 2016-07-17 DIAGNOSIS — M23231 Derangement of other medial meniscus due to old tear or injury, right knee: Secondary | ICD-10-CM | POA: Diagnosis not present

## 2016-07-17 DIAGNOSIS — Z91012 Allergy to eggs: Secondary | ICD-10-CM | POA: Diagnosis not present

## 2016-07-17 DIAGNOSIS — Z91018 Allergy to other foods: Secondary | ICD-10-CM | POA: Diagnosis not present

## 2016-07-17 DIAGNOSIS — Z808 Family history of malignant neoplasm of other organs or systems: Secondary | ICD-10-CM | POA: Insufficient documentation

## 2016-07-17 DIAGNOSIS — S83241A Other tear of medial meniscus, current injury, right knee, initial encounter: Secondary | ICD-10-CM | POA: Diagnosis not present

## 2016-07-17 HISTORY — DX: Headache, unspecified: R51.9

## 2016-07-17 HISTORY — PX: KNEE ARTHROSCOPY: SHX127

## 2016-07-17 HISTORY — DX: Headache: R51

## 2016-07-17 HISTORY — DX: Gastro-esophageal reflux disease without esophagitis: K21.9

## 2016-07-17 HISTORY — DX: Bitten or stung by nonvenomous insect and other nonvenomous arthropods, initial encounter: W57.XXXA

## 2016-07-17 HISTORY — DX: Unspecified osteoarthritis, unspecified site: M19.90

## 2016-07-17 SURGERY — ARTHROSCOPY, KNEE
Anesthesia: General | Site: Knee | Laterality: Right | Wound class: Clean

## 2016-07-17 MED ORDER — HYDROCODONE-ACETAMINOPHEN 5-325 MG PO TABS
1.0000 | ORAL_TABLET | Freq: Four times a day (QID) | ORAL | Status: DC | PRN
Start: 2016-07-17 — End: 2017-07-09

## 2016-07-17 MED ORDER — LACTATED RINGERS IV SOLN
INTRAVENOUS | Status: DC
  Administered 2016-07-17: 12:00:00 via INTRAVENOUS

## 2016-07-17 MED ORDER — ACETAMINOPHEN 160 MG/5ML PO SOLN: 325.0000 mg | ORAL | Status: DC | PRN

## 2016-07-17 MED ORDER — EPHEDRINE SULFATE 50 MG/ML IJ SOLN
INTRAMUSCULAR | Status: DC | PRN
  Administered 2016-07-17: 5 mg via INTRAVENOUS
  Administered 2016-07-17: 10 mg via INTRAVENOUS

## 2016-07-17 MED ORDER — LACTATED RINGERS IV SOLN: 500.0000 mL | INTRAVENOUS | Status: DC

## 2016-07-17 MED ORDER — METOCLOPRAMIDE HCL 5 MG/ML IJ SOLN
5.0000 mg | Freq: Three times a day (TID) | INTRAMUSCULAR | Status: DC | PRN
Start: 2016-07-17 — End: 2016-07-17

## 2016-07-17 MED ORDER — DEXAMETHASONE SODIUM PHOSPHATE 4 MG/ML IJ SOLN: 8.0000 mg | Freq: Once | INTRAMUSCULAR | Status: DC | PRN

## 2016-07-17 MED ORDER — ONDANSETRON HCL 4 MG PO TABS: 4.0000 mg | ORAL_TABLET | Freq: Four times a day (QID) | ORAL | Status: DC | PRN

## 2016-07-17 MED ORDER — BUPIVACAINE-EPINEPHRINE (PF) 0.5% -1:200000 IJ SOLN
INTRAMUSCULAR | Status: DC | PRN
  Administered 2016-07-17: 20 mL

## 2016-07-17 MED ORDER — OXYCODONE HCL 5 MG/5ML PO SOLN: 5.0000 mg | Freq: Once | ORAL | Status: DC | PRN

## 2016-07-17 MED ORDER — FENTANYL CITRATE (PF) 100 MCG/2ML IJ SOLN
INTRAMUSCULAR | Status: DC | PRN
  Administered 2016-07-17: 100 ug via INTRAVENOUS

## 2016-07-17 MED ORDER — CEFAZOLIN SODIUM-DEXTROSE 2-4 GM/100ML-% IV SOLN
2.0000 g | Freq: Once | INTRAVENOUS | Status: AC
  Administered 2016-07-17: 2 g via INTRAVENOUS

## 2016-07-17 MED ORDER — METOCLOPRAMIDE HCL 5 MG PO TABS: 5.0000 mg | ORAL_TABLET | Freq: Three times a day (TID) | ORAL | Status: DC | PRN

## 2016-07-17 MED ORDER — ACETAMINOPHEN 325 MG PO TABS: 325.0000 mg | ORAL_TABLET | ORAL | Status: DC | PRN

## 2016-07-17 MED ORDER — LIDOCAINE HCL (CARDIAC) 20 MG/ML IV SOLN
INTRAVENOUS | Status: DC | PRN
  Administered 2016-07-17: 50 mg via INTRATRACHEAL

## 2016-07-17 MED ORDER — DEXAMETHASONE SODIUM PHOSPHATE 4 MG/ML IJ SOLN
INTRAMUSCULAR | Status: DC | PRN
  Administered 2016-07-17: 4 mg via INTRAVENOUS

## 2016-07-17 MED ORDER — HYDROCODONE-ACETAMINOPHEN 5-325 MG PO TABS
1.0000 | ORAL_TABLET | ORAL | Status: DC | PRN
Start: 2016-07-17 — End: 2016-07-17

## 2016-07-17 MED ORDER — FENTANYL CITRATE (PF) 100 MCG/2ML IJ SOLN: 25.0000 ug | INTRAMUSCULAR | Status: DC | PRN

## 2016-07-17 MED ORDER — ONDANSETRON HCL 4 MG/2ML IJ SOLN
INTRAMUSCULAR | Status: DC | PRN
  Administered 2016-07-17: 4 mg via INTRAVENOUS

## 2016-07-17 MED ORDER — ONDANSETRON HCL 4 MG/2ML IJ SOLN: 4.0000 mg | Freq: Four times a day (QID) | INTRAMUSCULAR | Status: DC | PRN

## 2016-07-17 MED ORDER — POTASSIUM CHLORIDE IN NACL 20-0.9 MEQ/L-% IV SOLN: INTRAVENOUS | Status: DC

## 2016-07-17 MED ORDER — OXYCODONE HCL 5 MG PO TABS: 5.0000 mg | ORAL_TABLET | Freq: Once | ORAL | Status: DC | PRN

## 2016-07-17 MED ORDER — PROPOFOL 10 MG/ML IV BOLUS
INTRAVENOUS | Status: DC | PRN
  Administered 2016-07-17: 200 mg via INTRAVENOUS

## 2016-07-17 MED ORDER — GLYCOPYRROLATE 0.2 MG/ML IJ SOLN
INTRAMUSCULAR | Status: DC | PRN
  Administered 2016-07-17: .1 mg via INTRAVENOUS

## 2016-07-17 MED ORDER — MIDAZOLAM HCL 5 MG/5ML IJ SOLN
INTRAMUSCULAR | Status: DC | PRN
  Administered 2016-07-17: 2 mg via INTRAVENOUS

## 2016-07-17 MED ORDER — LIDOCAINE HCL 1 % IJ SOLN
INTRAMUSCULAR | Status: DC | PRN
  Administered 2016-07-17: 60 mL via INTRAMUSCULAR

## 2016-07-17 SURGICAL SUPPLY — 30 items
BANDAGE ELASTIC 6 VELCRO NS (GAUZE/BANDAGES/DRESSINGS) ×3 IMPLANT
BLADE FULL RADIUS 3.5 (BLADE) ×3 IMPLANT
BUR ACROMIONIZER 4.0 (BURR) IMPLANT
CHLORAPREP W/TINT 26ML (MISCELLANEOUS) ×3 IMPLANT
COVER LIGHT HANDLE UNIVERSAL (MISCELLANEOUS) ×6 IMPLANT
CUFF TOURN SGL QUICK 30 (MISCELLANEOUS)
CUFF TOURN SGL QUICK 34 (TOURNIQUET CUFF) ×2
CUFF TRNQT CYL 34X4X40X1 (TOURNIQUET CUFF) ×1 IMPLANT
CUFF TRNQT CYL LO 30X4X (MISCELLANEOUS) IMPLANT
DRAPE IMP U-DRAPE 54X76 (DRAPES) ×3 IMPLANT
GAUZE SPONGE 4X4 12PLY STRL (GAUZE/BANDAGES/DRESSINGS) ×3 IMPLANT
GLOVE BIO SURGEON STRL SZ8 (GLOVE) ×6 IMPLANT
GLOVE INDICATOR 8.0 STRL GRN (GLOVE) ×3 IMPLANT
GOWN STRL REUS W/ TWL LRG LVL3 (GOWN DISPOSABLE) ×1 IMPLANT
GOWN STRL REUS W/ TWL XL LVL3 (GOWN DISPOSABLE) ×1 IMPLANT
GOWN STRL REUS W/TWL LRG LVL3 (GOWN DISPOSABLE) ×2
GOWN STRL REUS W/TWL XL LVL3 (GOWN DISPOSABLE) ×2
IV LACTATED RINGER IRRG 3000ML (IV SOLUTION) ×2
IV LR IRRIG 3000ML ARTHROMATIC (IV SOLUTION) ×1 IMPLANT
KIT ROOM TURNOVER OR (KITS) ×3 IMPLANT
MANIFOLD 4PT FOR NEPTUNE1 (MISCELLANEOUS) ×6 IMPLANT
NEEDLE HYPO 21X1.5 SAFETY (NEEDLE) ×6 IMPLANT
PACK ARTHROSCOPY KNEE (MISCELLANEOUS) ×3 IMPLANT
STRAP BODY AND KNEE 60X3 (MISCELLANEOUS) ×3 IMPLANT
SUT PROLENE 4 0 PS 2 18 (SUTURE) ×3 IMPLANT
SUT VIC AB 2-0 CT1 27 (SUTURE)
SUT VIC AB 2-0 CT1 TAPERPNT 27 (SUTURE) IMPLANT
SYR 50ML LL SCALE MARK (SYRINGE) ×3 IMPLANT
TUBING ARTHRO INFLOW-ONLY STRL (TUBING) ×3 IMPLANT
WAND HAND CNTRL MULTIVAC 90 (MISCELLANEOUS) IMPLANT

## 2016-07-17 NOTE — Transfer of Care (Signed)
Immediate Anesthesia Transfer of Care Note  Patient: Samantha Dean  Procedure(s) Performed: Procedure(s): ARTHROSCOPY KNEE WITH DEBRIDEMENT AND PARTIAL MEDIAL MENISCECTOMY right knee (Right)  Patient Location: PACU  Anesthesia Type: General LMA  Level of Consciousness: awake, alert  and patient cooperative  Airway and Oxygen Therapy: Patient Spontanous Breathing and Patient connected to supplemental oxygen  Post-op Assessment: Post-op Vital signs reviewed, Patient's Cardiovascular Status Stable, Respiratory Function Stable, Patent Airway and No signs of Nausea or vomiting  Post-op Vital Signs: Reviewed and stable  Complications: No apparent anesthesia complications

## 2016-07-17 NOTE — Anesthesia Preprocedure Evaluation (Signed)
Anesthesia Evaluation  Patient identified by MRN, date of birth, ID band Patient awake    Reviewed: Allergy & Precautions, H&P , NPO status , Patient's Chart, lab work & pertinent test results, reviewed documented beta blocker date and time   Airway Mallampati: II  TM Distance: >3 FB Neck ROM: full    Dental no notable dental hx.    Pulmonary neg pulmonary ROS,    Pulmonary exam normal breath sounds clear to auscultation       Cardiovascular Exercise Tolerance: Good negative cardio ROS   Rhythm:regular Rate:Normal     Neuro/Psych  Headaches, PSYCHIATRIC DISORDERS    GI/Hepatic Neg liver ROS, GERD  Medicated,  Endo/Other  negative endocrine ROS  Renal/GU negative Renal ROS  negative genitourinary   Musculoskeletal   Abdominal   Peds  Hematology negative hematology ROS (+)   Anesthesia Other Findings   Reproductive/Obstetrics negative OB ROS                             Anesthesia Physical Anesthesia Plan  ASA: II  Anesthesia Plan: General LMA   Post-op Pain Management:    Induction:   Airway Management Planned:   Additional Equipment:   Intra-op Plan:   Post-operative Plan:   Informed Consent: I have reviewed the patients History and Physical, chart, labs and discussed the procedure including the risks, benefits and alternatives for the proposed anesthesia with the patient or authorized representative who has indicated his/her understanding and acceptance.     Plan Discussed with: CRNA  Anesthesia Plan Comments:         Anesthesia Quick Evaluation

## 2016-07-17 NOTE — Discharge Instructions (Signed)
General Anesthesia, Adult, Care After Refer to this sheet in the next few weeks. These instructions provide you with information on caring for yourself after your procedure. Your health care provider may also give you more specific instructions. Your treatment has been planned according to current medical practices, but problems sometimes occur. Call your health care provider if you have any problems or questions after your procedure. WHAT TO EXPECT AFTER THE PROCEDURE After the procedure, it is typical to experience:  Sleepiness.  Nausea and vomiting. HOME CARE INSTRUCTIONS  For the first 24 hours after general anesthesia:  Have a responsible person with you.  Do not drive a car. If you are alone, do not take public transportation.  Do not drink alcohol.  Do not take medicine that has not been prescribed by your health care provider.  Do not sign important papers or make important decisions.  You may resume a normal diet and activities as directed by your health care provider.  Change bandages (dressings) as directed.  If you have questions or problems that seem related to general anesthesia, call the hospital and ask for the anesthetist or anesthesiologist on call. SEEK MEDICAL CARE IF:  You have nausea and vomiting that continue the day after anesthesia.  You develop a rash. SEEK IMMEDIATE MEDICAL CARE IF:   You have difficulty breathing.  You have chest pain.  You have any allergic problems.   This information is not intended to replace advice given to you by your health care provider. Make sure you discuss any questions you have with your health care provider.   Document Released: 03/24/2001 Document Revised: 01/06/2015 Document Reviewed: 04/15/2012 Elsevier Interactive Patient Education 2016 [employer].  Keep dressing dry and intact.  May shower after dressing changed on post-op day #4 (Sunday).  Cover sutures with Band-Aids after drying off. Apply ice  frequently to knee. May weight-bear as tolerated - use crutches or walker as needed. Follow-up in 10-14 days or as scheduled.

## 2016-07-17 NOTE — H&P (Signed)
Paper H&P to be scanned into permanent record. H&P reviewed. No changes. 

## 2016-07-17 NOTE — Op Note (Signed)
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  Postoperative diagnosis:   Same.  Procedure:   Arthroscopic partial medial meniscectomy and abrasion chondroplasty of femoral trochlea, right knee.  Surgeon:   Pascal Lux, M.D.  Anesthesia:   General LMA.  Findings:   As above. There were areas of grade 2 chondromalacia involving the femoral trochlea, the medial femoral condyle, the medial tibial plateau, and the central ridge of the patella. The remaining articular surfaces all were in satisfactory condition. The lateral meniscus demonstrated a small area of fraying centrally but otherwise was intact. The anterior and posterior cruciate ligaments both were in excellent condition.  Complications:   None.  EBL:   20 cc.  Total fluids:   750 cc of crystalloid.  Tourniquet time:   None  Drains:   None  Closure:   4-0 Prolene interrupted sutures.  Brief clinical note:   The patient is a 60 year old female with a history of progressively worsening medial sided right knee pain. Her symptoms have progressed despite medications, activity modification, etc. Her history and examination are consistent with a medial meniscus tear which was confirmed by MRI scan. The MRI scan also demonstrated some degenerative changes of the medial and patellofemoral compartments. The patient presents at this time for arthroscopy, debridement, and partial medial meniscectomy.  Procedure:   The patient was brought into the operating room and lain in the supine position. After adequate general laryngal mask anesthesia was obtained, a timeout was performed to verify the appropriate side. The patient's right knee was injected sterilely using a solution of 30 cc of 1% lidocaine and 30 cc of 0.5% Sensorcaine with epinephrine. The right lower extremity was prepped with ChloraPrep solution before being draped sterilely. Preoperative  antibiotics were administered. The expected portal sites were injected with 0.5% Sensorcaine with epinephrine before the camera was placed in the anterolateral portal and instrumentation performed through the anteromedial portal. The knee was sequentially examined beginning in the suprapatellar pouch, then progressing to the patellofemoral space, the medial gutter compartment, the notch, and finally the lateral compartment and gutter. The findings were as described above. Abundant reactive synovial tissues anteriorly were debrided using the full-radius resector in order to improve visualization. The torn portion of the medial meniscus was debrided back to stable margins using a combination of the mini-munchers and full-radius resector. Subsequent probing of the remaining rim demonstrated excellent stability. Laterally, the mild fraying of the central portion of the lateral meniscus was debrided lightly with the full-radius resector. The meniscus itself was intact to probing. Areas of unstable articular cartilage on the femoral trochlea were lightly debrided using the full-radius resector. The instruments were removed from the joint after suctioning the excess fluid. The portal sites were closed using 4-0 Prolene interrupted sutures before a sterile bulky dressing was applied to the knee. The patient was then awakened, extubated, and returned to the recovery room in satisfactory condition after tolerating the procedure well.

## 2016-07-17 NOTE — Anesthesia Procedure Notes (Signed)
Procedure Name: LMA Insertion Date/Time: 07/17/2016 1:05 PM Performed by: Mayme Genta Pre-anesthesia Checklist: Patient identified, Emergency Drugs available, Suction available, Timeout performed and Patient being monitored Patient Re-evaluated:Patient Re-evaluated prior to inductionOxygen Delivery Method: Circle system utilized Preoxygenation: Pre-oxygenation with 100% oxygen Intubation Type: IV induction LMA: LMA inserted LMA Size: 4.0 Number of attempts: 1 Placement Confirmation: positive ETCO2 and breath sounds checked- equal and bilateral Tube secured with: Tape

## 2016-07-17 NOTE — Anesthesia Postprocedure Evaluation (Signed)
Anesthesia Post Note  Patient: Samantha Dean  Procedure(s) Performed: Procedure(s) (LRB): ARTHROSCOPY KNEE WITH DEBRIDEMENT AND PARTIAL MEDIAL MENISCECTOMY right knee (Right)  Patient location during evaluation: PACU Anesthesia Type: General Level of consciousness: awake and alert Pain management: pain level controlled Vital Signs Assessment: post-procedure vital signs reviewed and stable Respiratory status: spontaneous breathing, nonlabored ventilation and respiratory function stable Cardiovascular status: blood pressure returned to baseline and stable Postop Assessment: no signs of nausea or vomiting Anesthetic complications: no    Hazelene Doten D Shaiden Aldous

## 2016-07-18 ENCOUNTER — Encounter: Payer: Self-pay | Admitting: Surgery

## 2016-08-20 DIAGNOSIS — R309 Painful micturition, unspecified: Secondary | ICD-10-CM | POA: Diagnosis not present

## 2016-09-17 DIAGNOSIS — F209 Schizophrenia, unspecified: Secondary | ICD-10-CM | POA: Diagnosis not present

## 2016-10-01 DIAGNOSIS — N760 Acute vaginitis: Secondary | ICD-10-CM | POA: Diagnosis not present

## 2016-10-01 DIAGNOSIS — Z1239 Encounter for other screening for malignant neoplasm of breast: Secondary | ICD-10-CM | POA: Diagnosis not present

## 2016-10-01 DIAGNOSIS — B356 Tinea cruris: Secondary | ICD-10-CM | POA: Diagnosis not present

## 2016-10-18 DIAGNOSIS — M1711 Unilateral primary osteoarthritis, right knee: Secondary | ICD-10-CM | POA: Diagnosis not present

## 2016-10-18 DIAGNOSIS — S83231D Complex tear of medial meniscus, current injury, right knee, subsequent encounter: Secondary | ICD-10-CM | POA: Diagnosis not present

## 2016-10-21 DIAGNOSIS — F250 Schizoaffective disorder, bipolar type: Secondary | ICD-10-CM | POA: Diagnosis not present

## 2016-10-25 DIAGNOSIS — J029 Acute pharyngitis, unspecified: Secondary | ICD-10-CM | POA: Diagnosis not present

## 2016-10-28 ENCOUNTER — Other Ambulatory Visit: Payer: Self-pay | Admitting: Internal Medicine

## 2016-10-28 DIAGNOSIS — E669 Obesity, unspecified: Secondary | ICD-10-CM | POA: Diagnosis not present

## 2016-10-28 DIAGNOSIS — Z1231 Encounter for screening mammogram for malignant neoplasm of breast: Secondary | ICD-10-CM

## 2016-10-28 DIAGNOSIS — E282 Polycystic ovarian syndrome: Secondary | ICD-10-CM | POA: Diagnosis not present

## 2016-10-28 DIAGNOSIS — F250 Schizoaffective disorder, bipolar type: Secondary | ICD-10-CM | POA: Diagnosis not present

## 2016-10-28 DIAGNOSIS — Z0001 Encounter for general adult medical examination with abnormal findings: Secondary | ICD-10-CM | POA: Diagnosis not present

## 2016-10-28 DIAGNOSIS — E2839 Other primary ovarian failure: Secondary | ICD-10-CM | POA: Diagnosis not present

## 2016-11-05 DIAGNOSIS — F209 Schizophrenia, unspecified: Secondary | ICD-10-CM | POA: Diagnosis not present

## 2016-11-06 DIAGNOSIS — E2839 Other primary ovarian failure: Secondary | ICD-10-CM | POA: Diagnosis not present

## 2016-11-26 DIAGNOSIS — R300 Dysuria: Secondary | ICD-10-CM | POA: Diagnosis not present

## 2016-11-26 DIAGNOSIS — J0100 Acute maxillary sinusitis, unspecified: Secondary | ICD-10-CM | POA: Diagnosis not present

## 2016-12-05 ENCOUNTER — Ambulatory Visit
Admission: RE | Admit: 2016-12-05 | Discharge: 2016-12-05 | Disposition: A | Discharge status: Home / Self Care | Payer: Commercial Managed Care - HMO | Source: Ambulatory Visit | Attending: Internal Medicine | Admitting: Internal Medicine

## 2016-12-05 DIAGNOSIS — Z1231 Encounter for screening mammogram for malignant neoplasm of breast: Secondary | ICD-10-CM | POA: Insufficient documentation

## 2017-01-01 DIAGNOSIS — F209 Schizophrenia, unspecified: Secondary | ICD-10-CM | POA: Diagnosis not present

## 2017-01-17 DIAGNOSIS — J019 Acute sinusitis, unspecified: Secondary | ICD-10-CM | POA: Diagnosis not present

## 2017-01-17 DIAGNOSIS — B9689 Other specified bacterial agents as the cause of diseases classified elsewhere: Secondary | ICD-10-CM | POA: Diagnosis not present

## 2017-01-30 DIAGNOSIS — B9689 Other specified bacterial agents as the cause of diseases classified elsewhere: Secondary | ICD-10-CM | POA: Diagnosis not present

## 2017-01-30 DIAGNOSIS — J019 Acute sinusitis, unspecified: Secondary | ICD-10-CM | POA: Diagnosis not present

## 2017-02-13 DIAGNOSIS — F209 Schizophrenia, unspecified: Secondary | ICD-10-CM | POA: Diagnosis not present

## 2017-03-03 DIAGNOSIS — J0111 Acute recurrent frontal sinusitis: Secondary | ICD-10-CM | POA: Diagnosis not present

## 2017-03-17 DIAGNOSIS — W57XXXA Bitten or stung by nonvenomous insect and other nonvenomous arthropods, initial encounter: Secondary | ICD-10-CM | POA: Diagnosis not present

## 2017-03-17 DIAGNOSIS — S30860A Insect bite (nonvenomous) of lower back and pelvis, initial encounter: Secondary | ICD-10-CM | POA: Diagnosis not present

## 2017-03-26 DIAGNOSIS — W57XXXD Bitten or stung by nonvenomous insect and other nonvenomous arthropods, subsequent encounter: Secondary | ICD-10-CM | POA: Diagnosis not present

## 2017-03-26 DIAGNOSIS — S30860D Insect bite (nonvenomous) of lower back and pelvis, subsequent encounter: Secondary | ICD-10-CM | POA: Diagnosis not present

## 2017-03-26 DIAGNOSIS — R509 Fever, unspecified: Secondary | ICD-10-CM | POA: Diagnosis not present

## 2017-04-09 DIAGNOSIS — M25562 Pain in left knee: Secondary | ICD-10-CM | POA: Diagnosis not present

## 2017-04-09 DIAGNOSIS — M1712 Unilateral primary osteoarthritis, left knee: Secondary | ICD-10-CM | POA: Diagnosis not present

## 2017-04-17 DIAGNOSIS — H43813 Vitreous degeneration, bilateral: Secondary | ICD-10-CM | POA: Diagnosis not present

## 2017-04-22 DIAGNOSIS — F209 Schizophrenia, unspecified: Secondary | ICD-10-CM | POA: Diagnosis not present

## 2017-04-25 DIAGNOSIS — M25562 Pain in left knee: Secondary | ICD-10-CM | POA: Diagnosis not present

## 2017-04-25 DIAGNOSIS — M1712 Unilateral primary osteoarthritis, left knee: Secondary | ICD-10-CM | POA: Diagnosis not present

## 2017-05-01 ENCOUNTER — Other Ambulatory Visit: Payer: Self-pay | Admitting: Student

## 2017-05-01 DIAGNOSIS — M1712 Unilateral primary osteoarthritis, left knee: Secondary | ICD-10-CM

## 2017-05-01 DIAGNOSIS — M25562 Pain in left knee: Secondary | ICD-10-CM

## 2017-05-15 ENCOUNTER — Ambulatory Visit
Admission: RE | Admit: 2017-05-15 | Discharge: 2017-05-15 | Disposition: A | Discharge status: Home / Self Care | Payer: Medicare HMO | Source: Ambulatory Visit | Attending: Student | Admitting: Student

## 2017-05-15 DIAGNOSIS — M1712 Unilateral primary osteoarthritis, left knee: Secondary | ICD-10-CM | POA: Insufficient documentation

## 2017-05-15 DIAGNOSIS — S83242A Other tear of medial meniscus, current injury, left knee, initial encounter: Secondary | ICD-10-CM | POA: Insufficient documentation

## 2017-05-15 DIAGNOSIS — M25562 Pain in left knee: Secondary | ICD-10-CM

## 2017-05-15 DIAGNOSIS — X58XXXA Exposure to other specified factors, initial encounter: Secondary | ICD-10-CM | POA: Insufficient documentation

## 2017-05-21 DIAGNOSIS — R300 Dysuria: Secondary | ICD-10-CM | POA: Diagnosis not present

## 2017-05-21 DIAGNOSIS — N390 Urinary tract infection, site not specified: Secondary | ICD-10-CM | POA: Diagnosis not present

## 2017-05-21 DIAGNOSIS — A499 Bacterial infection, unspecified: Secondary | ICD-10-CM | POA: Diagnosis not present

## 2017-05-21 DIAGNOSIS — S83242D Other tear of medial meniscus, current injury, left knee, subsequent encounter: Secondary | ICD-10-CM | POA: Diagnosis not present

## 2017-05-21 DIAGNOSIS — M1712 Unilateral primary osteoarthritis, left knee: Secondary | ICD-10-CM | POA: Diagnosis not present

## 2017-06-02 DIAGNOSIS — J069 Acute upper respiratory infection, unspecified: Secondary | ICD-10-CM | POA: Diagnosis not present

## 2017-06-23 DIAGNOSIS — J0111 Acute recurrent frontal sinusitis: Secondary | ICD-10-CM | POA: Diagnosis not present

## 2017-07-09 ENCOUNTER — Ambulatory Visit: Payer: Medicare HMO | Admitting: Anesthesiology

## 2017-07-09 ENCOUNTER — Encounter
Admission: RE | Disposition: A | Discharge status: Home / Self Care | Payer: Self-pay | Source: Ambulatory Visit | Attending: Surgery

## 2017-07-09 ENCOUNTER — Ambulatory Visit
Admission: RE | Admit: 2017-07-09 | Discharge: 2017-07-09 | Disposition: A | Discharge status: Home / Self Care | Payer: Medicare HMO | Source: Ambulatory Visit | Attending: Surgery | Admitting: Surgery

## 2017-07-09 DIAGNOSIS — Z791 Long term (current) use of non-steroidal anti-inflammatories (NSAID): Secondary | ICD-10-CM | POA: Diagnosis not present

## 2017-07-09 DIAGNOSIS — X501XXA Overexertion from prolonged static or awkward postures, initial encounter: Secondary | ICD-10-CM | POA: Insufficient documentation

## 2017-07-09 DIAGNOSIS — F329 Major depressive disorder, single episode, unspecified: Secondary | ICD-10-CM | POA: Insufficient documentation

## 2017-07-09 DIAGNOSIS — Z7951 Long term (current) use of inhaled steroids: Secondary | ICD-10-CM | POA: Insufficient documentation

## 2017-07-09 DIAGNOSIS — M23242 Derangement of anterior horn of lateral meniscus due to old tear or injury, left knee: Secondary | ICD-10-CM | POA: Diagnosis not present

## 2017-07-09 DIAGNOSIS — Z79899 Other long term (current) drug therapy: Secondary | ICD-10-CM | POA: Diagnosis not present

## 2017-07-09 DIAGNOSIS — Y93K1 Activity, walking an animal: Secondary | ICD-10-CM | POA: Insufficient documentation

## 2017-07-09 DIAGNOSIS — K219 Gastro-esophageal reflux disease without esophagitis: Secondary | ICD-10-CM | POA: Diagnosis not present

## 2017-07-09 DIAGNOSIS — S83242A Other tear of medial meniscus, current injury, left knee, initial encounter: Secondary | ICD-10-CM | POA: Diagnosis not present

## 2017-07-09 DIAGNOSIS — M1711 Unilateral primary osteoarthritis, right knee: Secondary | ICD-10-CM | POA: Diagnosis not present

## 2017-07-09 DIAGNOSIS — M1712 Unilateral primary osteoarthritis, left knee: Secondary | ICD-10-CM | POA: Insufficient documentation

## 2017-07-09 DIAGNOSIS — S83232A Complex tear of medial meniscus, current injury, left knee, initial encounter: Secondary | ICD-10-CM | POA: Diagnosis not present

## 2017-07-09 HISTORY — PX: KNEE ARTHROSCOPY: SHX127

## 2017-07-09 HISTORY — PX: CHONDROPLASTY: SHX5177

## 2017-07-09 SURGERY — ARTHROSCOPY, KNEE
Anesthesia: General | Laterality: Left | Wound class: Clean

## 2017-07-09 MED ORDER — ONDANSETRON HCL 4 MG/2ML IJ SOLN
INTRAMUSCULAR | Status: DC | PRN
  Administered 2017-07-09: 4 mg via INTRAVENOUS

## 2017-07-09 MED ORDER — LIDOCAINE HCL (CARDIAC) 20 MG/ML IV SOLN
INTRAVENOUS | Status: DC | PRN
  Administered 2017-07-09: 50 mg via INTRATRACHEAL

## 2017-07-09 MED ORDER — PROPOFOL 10 MG/ML IV BOLUS
INTRAVENOUS | Status: DC | PRN
  Administered 2017-07-09: 200 mg via INTRAVENOUS

## 2017-07-09 MED ORDER — HYDROCODONE-ACETAMINOPHEN 5-325 MG PO TABS: 1.0000 | ORAL_TABLET | ORAL | 0 refills | Status: DC | PRN

## 2017-07-09 MED ORDER — LACTATED RINGERS IV SOLN
INTRAVENOUS | Status: DC
  Administered 2017-07-09: 11:00:00 via INTRAVENOUS

## 2017-07-09 MED ORDER — LIDOCAINE-EPINEPHRINE 1 %-1:100000 IJ SOLN
INTRAMUSCULAR | Status: DC | PRN
  Administered 2017-07-09: 30 mL
  Administered 2017-07-09: 20 mL

## 2017-07-09 MED ORDER — FENTANYL CITRATE (PF) 100 MCG/2ML IJ SOLN
INTRAMUSCULAR | Status: DC | PRN
  Administered 2017-07-09: 50 ug via INTRAVENOUS

## 2017-07-09 MED ORDER — DEXAMETHASONE SODIUM PHOSPHATE 4 MG/ML IJ SOLN
INTRAMUSCULAR | Status: DC | PRN
  Administered 2017-07-09: 4 mg via INTRAVENOUS

## 2017-07-09 MED ORDER — DEXTROSE 5 % IV SOLN
2000.0000 mg | Freq: Once | INTRAVENOUS | Status: AC
  Administered 2017-07-09: 2000 mg via INTRAVENOUS

## 2017-07-09 MED ORDER — MIDAZOLAM HCL 5 MG/5ML IJ SOLN
INTRAMUSCULAR | Status: DC | PRN
  Administered 2017-07-09: 2 mg via INTRAVENOUS

## 2017-07-09 MED ORDER — ROPIVACAINE HCL 5 MG/ML IJ SOLN
INTRAMUSCULAR | Status: DC | PRN
  Administered 2017-07-09: 30 mL

## 2017-07-09 SURGICAL SUPPLY — 30 items
BANDAGE ELASTIC 6 LF NS (GAUZE/BANDAGES/DRESSINGS) ×4 IMPLANT
BLADE FULL RADIUS 3.5 (BLADE) ×4 IMPLANT
BUR ACROMIONIZER 4.0 (BURR) IMPLANT
CHLORAPREP W/TINT 26ML (MISCELLANEOUS) ×4 IMPLANT
COVER LIGHT HANDLE UNIVERSAL (MISCELLANEOUS) ×8 IMPLANT
CUFF TOURN SGL QUICK 30 (MISCELLANEOUS) ×2
CUFF TOURN SGL QUICK 34 (TOURNIQUET CUFF)
CUFF TRNQT CYL 34X4X40X1 (TOURNIQUET CUFF) IMPLANT
CUFF TRNQT CYL LO 30X4X (MISCELLANEOUS) ×2 IMPLANT
DRAPE IMP U-DRAPE 54X76 (DRAPES) ×4 IMPLANT
GAUZE SPONGE 4X4 12PLY STRL (GAUZE/BANDAGES/DRESSINGS) ×4 IMPLANT
GLOVE BIO SURGEON STRL SZ8 (GLOVE) ×8 IMPLANT
GLOVE INDICATOR 8.0 STRL GRN (GLOVE) ×4 IMPLANT
GOWN STRL REUS W/ TWL LRG LVL3 (GOWN DISPOSABLE) ×4 IMPLANT
GOWN STRL REUS W/ TWL XL LVL3 (GOWN DISPOSABLE) ×2 IMPLANT
GOWN STRL REUS W/TWL LRG LVL3 (GOWN DISPOSABLE) ×4
GOWN STRL REUS W/TWL XL LVL3 (GOWN DISPOSABLE) ×2
IV LACTATED RINGER IRRG 3000ML (IV SOLUTION) ×2
IV LR IRRIG 3000ML ARTHROMATIC (IV SOLUTION) ×2 IMPLANT
KIT ROOM TURNOVER OR (KITS) ×4 IMPLANT
MANIFOLD 4PT FOR NEPTUNE1 (MISCELLANEOUS) ×4 IMPLANT
NEEDLE HYPO 21X1.5 SAFETY (NEEDLE) ×8 IMPLANT
PACK ARTHROSCOPY KNEE (MISCELLANEOUS) ×4 IMPLANT
STRAP BODY AND KNEE 60X3 (MISCELLANEOUS) ×4 IMPLANT
SUT PROLENE 4 0 PS 2 18 (SUTURE) ×4 IMPLANT
SUT VIC AB 2-0 CT1 27 (SUTURE)
SUT VIC AB 2-0 CT1 TAPERPNT 27 (SUTURE) IMPLANT
SYR 50ML LL SCALE MARK (SYRINGE) ×4 IMPLANT
TUBING ARTHRO INFLOW-ONLY STRL (TUBING) ×4 IMPLANT
WAND HAND CNTRL MULTIVAC 90 (MISCELLANEOUS) ×4 IMPLANT

## 2017-07-09 NOTE — Transfer of Care (Signed)
Immediate Anesthesia Transfer of Care Note  Patient: Samantha Dean  Procedure(s) Performed: Procedure(s): ARTHROSCOPY KNEE  with debridement and medial meniscectomy (Left)  Patient Location: PACU  Anesthesia Type: General  Level of Consciousness: awake, alert  and patient cooperative  Airway and Oxygen Therapy: Patient Spontanous Breathing and Patient connected to supplemental oxygen  Post-op Assessment: Post-op Vital signs reviewed, Patient's Cardiovascular Status Stable, Respiratory Function Stable, Patent Airway and No signs of Nausea or vomiting  Post-op Vital Signs: Reviewed and stable  Complications: No apparent anesthesia complications

## 2017-07-09 NOTE — Discharge Instructions (Signed)
General Anesthesia, Adult, Care After These instructions provide you with information about caring for yourself after your procedure. Your health care provider may also give you more specific instructions. Your treatment has been planned according to current medical practices, but problems sometimes occur. Call your health care provider if you have any problems or questions after your procedure. What can I expect after the procedure? After the procedure, it is common to have:  Vomiting.  A sore throat.  Mental slowness.  It is common to feel:  Nauseous.  Cold or shivery.  Sleepy.  Tired.  Sore or achy, even in parts of your body where you did not have surgery.  Follow these instructions at home: For at least 24 hours after the procedure:  Do not: ? Participate in activities where you could fall or become injured. ? Drive. ? Use heavy machinery. ? Drink alcohol. ? Take sleeping pills or medicines that cause drowsiness. ? Make important decisions or sign legal documents. ? Take care of children on your own.  Rest. Eating and drinking  If you vomit, drink water, juice, or soup when you can drink without vomiting.  Drink enough fluid to keep your urine clear or pale yellow.  Make sure you have little or no nausea before eating solid foods.  Follow the diet recommended by your health care provider. General instructions  Have a responsible adult stay with you until you are awake and alert.  Return to your normal activities as told by your health care provider. Ask your health care provider what activities are safe for you.  Take over-the-counter and prescription medicines only as told by your health care provider.  If you smoke, do not smoke without supervision.  Keep all follow-up visits as told by your health care provider. This is important. Contact a health care provider if:  You continue to have nausea or vomiting at home, and medicines are not helpful.  You  cannot drink fluids or start eating again.  You cannot urinate after 8-12 hours.  You develop a skin rash.  You have fever.  You have increasing redness at the site of your procedure. Get help right away if:  You have difficulty breathing.  You have chest pain.  You have unexpected bleeding.  You feel that you are having a life-threatening or urgent problem. This information is not intended to replace advice given to you by your health care provider. Make sure you discuss any questions you have with your health care provider. Document Released: 03/24/2001 Document Revised: 05/20/2016 Document Reviewed: 11/30/2015 Elsevier Interactive Patient Education  2018 [employer].  Keep dressing dry and intact.  May shower after dressing changed on post-op day #4 (Sunday).  Cover sutures with Band-Aids after drying off. Apply ice frequently to knee. Resume Mobic daily. Take pain medication as prescribed or ES Tylenol when needed.  May weight-bear as tolerated - use crutches or walker as needed. Follow-up in 10-14 days or as scheduled.

## 2017-07-09 NOTE — Anesthesia Procedure Notes (Signed)
Procedure Name: LMA Insertion Date/Time: 07/09/2017 12:48 PM Performed by: Londell Moh Pre-anesthesia Checklist: Patient identified, Emergency Drugs available, Suction available, Timeout performed and Patient being monitored Patient Re-evaluated:Patient Re-evaluated prior to induction Oxygen Delivery Method: Circle system utilized Preoxygenation: Pre-oxygenation with 100% oxygen Induction Type: IV induction LMA: LMA inserted LMA Size: 4.0 Number of attempts: 1 Placement Confirmation: positive ETCO2 and breath sounds checked- equal and bilateral Tube secured with: Tape

## 2017-07-09 NOTE — H&P (Signed)
Paper H&P to be scanned into permanent record. H&P reviewed and patient re-examined. No changes. 

## 2017-07-09 NOTE — Anesthesia Preprocedure Evaluation (Signed)
Anesthesia Evaluation  Patient identified by MRN, date of birth, ID band Patient awake    Reviewed: Allergy & Precautions, NPO status , Patient's Chart, lab work & pertinent test results  History of Anesthesia Complications Negative for: history of anesthetic complications  Airway Mallampati: II  TM Distance: >3 FB Neck ROM: Full    Dental no notable dental hx.    Pulmonary neg pulmonary ROS,    Pulmonary exam normal breath sounds clear to auscultation       Cardiovascular Exercise Tolerance: Good negative cardio ROS Normal cardiovascular exam Rhythm:Regular Rate:Normal     Neuro/Psych Depression Schizophrenia negative neurological ROS     GI/Hepatic Neg liver ROS, GERD  Controlled and Medicated,  Endo/Other  PCOS  Renal/GU negative Renal ROS     Musculoskeletal  (+) Arthritis , Osteoarthritis,    Abdominal   Peds  Hematology negative hematology ROS (+)   Anesthesia Other Findings   Reproductive/Obstetrics                             Anesthesia Physical Anesthesia Plan  ASA: II  Anesthesia Plan: General   Post-op Pain Management:    Induction: Intravenous  PONV Risk Score and Plan: 2 and Ondansetron and Dexamethasone  Airway Management Planned: LMA  Additional Equipment:   Intra-op Plan:   Post-operative Plan: Extubation in OR  Informed Consent: I have reviewed the patients History and Physical, chart, labs and discussed the procedure including the risks, benefits and alternatives for the proposed anesthesia with the patient or authorized representative who has indicated his/her understanding and acceptance.     Plan Discussed with: CRNA  Anesthesia Plan Comments:         Anesthesia Quick Evaluation

## 2017-07-09 NOTE — Anesthesia Postprocedure Evaluation (Signed)
Anesthesia Post Note  Patient: Samantha Dean  Procedure(s) Performed: Procedure(s) (LRB): ARTHROSCOPY KNEE  with debridement and medial meniscectomy (Left)  Patient location during evaluation: PACU Anesthesia Type: General Level of consciousness: awake and alert, oriented and patient cooperative Pain management: pain level controlled Vital Signs Assessment: post-procedure vital signs reviewed and stable Respiratory status: spontaneous breathing, nonlabored ventilation and respiratory function stable Cardiovascular status: blood pressure returned to baseline and stable Postop Assessment: adequate PO intake Anesthetic complications: no    Darrin Nipper

## 2017-07-09 NOTE — Op Note (Signed)
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  Postoperative diagnosis:   Same.  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  Surgeon:   Pascal Lux, M.D.  Anesthesia:   General LMA.  Findings:   As above. The lateral meniscus demonstrated some degenerative fraying of the superficial surface of the anterior horn of the lateral meniscus, but otherwise was intact. There were grade 2-3 chondromalacial changes involving the medial tibial plateau, and grade 1-2 chondromalacial changes involving the lateral tibial plateau, the femoral trochlea, and the patella. The anterior and posterior cruciate ligament both were in satisfactory condition.  Complications:   None.  EBL:   <5 cc.  Total fluids:   600 cc of crystalloid.  Tourniquet time:   None  Drains:   None  Closure:   4-0 Prolene interrupted sutures.  Brief clinical note:   The patient is a 61 year old female with a several month history of medial sided left knee pain following a twisting injury when she was pulled down while walking her dog. Her symptoms have persisted despite medications, activity modification, etc. Her history and examination were consistent with a medial meniscus tear which was confirmed by MRI scan.. The patient presents at this time for arthroscopy, debridement, and partial medial meniscectomy.  Procedure:   The patient was brought into the operating room and lain in the supine position. After adequate general laryngeal mask anesthesia was obtained, a timeout was performed to verify the appropriate side. The patient's left knee was injected sterilely using a solution of 30 cc of 1% lidocaine and 30 cc of 0.5% Sensorcaine with epinephrine. The left lower extremity was prepped with ChloraPrep solution  before being draped sterilely. Preoperative antibiotics were administered. The expected portal sites were injected with 0.5% Sensorcaine with epinephrine before the camera was placed in the anterolateral portal and instrumentation performed through the anteromedial portal. The knee was sequentially examined beginning in the suprapatellar pouch, then progressing to the patellofemoral space, the medial gutter compartment, the notch, and finally the lateral compartment and gutter. The findings were as described above. Abundant reactive synovial tissues anteriorly were debrided using the full-radius resector in order to improve visualization. The unstable portion of the medial meniscus tear was debrided back to stable margins using a combination of the straight and up-biting mini-munchers, the side-biting baskets, and the full-radius resector. Subsequent probing of the remaining rim demonstrated excellent stability. The area of articular cartilage damage on the weightbearing portion of the medial femoral condyle was debrided back to stable margins using the full-radius resector before the margins were lightly "annealed" using the coag setting of the ArthroCare device. The instruments were removed from the joint after suctioning the excess fluid. The portal sites were closed using 4-0 Prolene interrupted sutures before a sterile bulky dressing was applied to the knee. The patient was then awakened, extubated, and returned to the recovery room in satisfactory condition after tolerating the procedure well.

## 2017-07-10 ENCOUNTER — Encounter: Payer: Self-pay | Admitting: Surgery

## 2017-07-25 DIAGNOSIS — F209 Schizophrenia, unspecified: Secondary | ICD-10-CM | POA: Diagnosis not present

## 2017-08-11 ENCOUNTER — Ambulatory Visit (INDEPENDENT_AMBULATORY_CARE_PROVIDER_SITE_OTHER): Payer: Medicare HMO | Admitting: Obstetrics & Gynecology

## 2017-08-11 ENCOUNTER — Encounter: Payer: Self-pay | Admitting: Obstetrics & Gynecology

## 2017-08-11 VITALS — BP 130/90 | HR 80 | Ht 60.0 in | Wt 202.0 lb

## 2017-08-11 DIAGNOSIS — N952 Postmenopausal atrophic vaginitis: Secondary | ICD-10-CM | POA: Diagnosis not present

## 2017-08-11 DIAGNOSIS — N761 Subacute and chronic vaginitis: Secondary | ICD-10-CM | POA: Diagnosis not present

## 2017-08-11 MED ORDER — METRONIDAZOLE 0.75 % VA GEL: 1.0000 | Freq: Every day | VAGINAL | 0 refills | Status: AC

## 2017-08-11 MED ORDER — CLOTRIMAZOLE-BETAMETHASONE 1-0.05 % EX CREA: 1.0000 "application " | TOPICAL_CREAM | Freq: Two times a day (BID) | CUTANEOUS | 1 refills | Status: DC | PRN

## 2017-08-11 NOTE — Patient Instructions (Signed)
Betamethasone; Clotrimazole skin cream What is this medicine? BETAMETHASONE; CLOTRIMAZOLE (bay ta METH a sone; kloe TRIM a zole) is a corticosteroid and antifungal cream. It treats ringworm and infections like jock itch and athlete's foot. It also helps reduce swelling, redness, and itching caused by these infections. This medicine may be used for other purposes; ask your health care provider or pharmacist if you have questions. COMMON BRAND NAME(S): Lotrisone What should I tell my health care provider before I take this medicine? They need to know if you have any of these conditions: -large areas of burned or damaged skin -skin thinning -peripheral vascular disease or poor circulation -an unusual or allergic reaction to betamethasone, clotrimazole, other corticosteroids, other antifungals, other medicines, foods, dyes, or preservatives -pregnant or trying to get pregnant -breast-feeding How should I use this medicine? This cream is for external use only. Do not take by mouth. Follow the directions on the prescription label. Wash your hands before and after use. If treating hand or nail infections, wash hands before use only. Apply a thin layer of cream to the affected area and rub in gently. Do not cover or wrap the treated area with an airtight bandage (like a plastic bandage). Use the cream for the full course of treatment prescribed, even if you think the condition is getting better. Use the medicine at regular intervals. Do not use more often than directed. Do not use on healthy skin or over large areas of skin. Do not use this medicine for any condition other than the one for which it was prescribed. When applying to the groin area, apply a small amount and do not use for longer than 2 weeks unless directed to by your doctor or health care professional. Do not get this cream in your eyes. If you do, rinse out with plenty of cool tap water. Talk to your pediatrician regarding the use of this  medicine in children. While this drug may be prescribed for children as young as 17 years for selected conditions, precautions do apply. Patients over 65 years old may have a stronger reaction and need a smaller dose. Overdosage: If you think you have taken too much of this medicine contact a poison control center or emergency room at once. NOTE: This medicine is only for you. Do not share this medicine with others. What if I miss a dose? If you miss a dose, use it as soon as you can. If it is almost time for your next dose, use only that dose. Do not use double or take extra doses. What may interact with this medicine? -topical products that have nystatin This list may not describe all possible interactions. Give your health care provider a list of all the medicines, herbs, non-prescription drugs, or dietary supplements you use. Also tell them if you smoke, drink alcohol, or use illegal drugs. Some items may interact with your medicine. What should I watch for while using this medicine? If using this medicine on your body or groin tell your doctor or health care professional if your symptoms do not improve within 1 week. If using this medicine on your feet tell your doctor or health care professional if your symptoms do not improve within 2 weeks. Tell your doctor if your skin infection returns after you stop using this cream. If you are using this cream for 'jock itch' be sure to dry the groin completely after bathing. Do not wear underwear that is tight-fitting or made from synthetic fibers like rayon or   nylon. Wear loose-fitting, cotton underwear. If you are using this cream for athlete's foot be sure to dry your feet carefully after bathing, especially between the toes. Do not wear socks made from wool or synthetic materials like rayon or nylon. Wear clean cotton socks and change them at least once a day, change them more if your feet sweat a lot. Also, try to wear sandals or shoes that are  well-ventilated. Do not use this cream to treat diaper rash. What side effects may I notice from receiving this medicine? Side effects that you should report to your doctor or health care professional as soon as possible: -allergic reactions like skin rash, itching or hives, swelling of the face, lips, or tongue -dark red spots on the skin -lack of healing of skin condition -loss of feeling on skin -painful, red, pus-filled blisters in hair follicles -skin infection -sores or blisters that do not heal properly -thinning of the skin or sunburn Side effects that usually do not require medical attention (report to your doctor or health care professional if they continue or are bothersome): -dry or peeling skin -minor skin irritation, burning, or itching This list may not describe all possible side effects. Call your doctor for medical advice about side effects. You may report side effects to FDA at 1-800-FDA-1088. Where should I keep my medicine? Keep out of the reach of children. Store at room temperature between 15 and 30 degrees C ( 59 and 86 degrees F). Do not freeze. Throw away any unused medicine after the expiration date. NOTE: This sheet is a summary. It may not cover all possible information. If you have questions about this medicine, talk to your doctor, pharmacist, or health care provider.  2018 Elsevier/Gold Standard (2008-03-16 16:14:28)  

## 2017-08-11 NOTE — Progress Notes (Signed)
  HPI:      Ms. AGUEDA HOUPT is a 61 y.o. No obstetric history on file. who LMP was No LMP recorded. Patient is postmenopausal., presents today for a problem visit.  She complains of:  Vaginitis: Patient complains of an burning per vagina for one month, and recent abnormal vaginal discharge for 1 week. Vaginal symptoms include burning and scant d/c, no itch.Vulvar symptoms include burning and even reddish rash.STI Risk: Very low risk of STD exposureDischarge described as: scant and white.Other associated symptoms: none.Menstrual pattern: She had been bleeding menopausal. Contraception: none  PMHx: She  has a past medical history of Arthritis; Depression; GERD (gastroesophageal reflux disease); Headache; Polycystic ovarian syndrome; Schizophrenia ([place]); and Tick bite. Also,  has a past surgical history that includes Cesarean section; Esophagogastroduodenoscopy (egd) with propofol (N/A, 05/15/2016); Dilation and curettage of uterus (approx 2004); Ovarian cyst removal (1986); Diagnostic laparoscopy (7619); Knee arthroscopy (Right, 07/17/2016); Knee arthroscopy (Left, 07/09/2017); and Chondroplasty (07/09/2017)., family history is not on file.,  reports that she has never smoked. She has never used smokeless tobacco. She reports that she does not drink alcohol or use drugs.  She has a current medication list which includes the following prescription(s): alendronate, azithromycin, biotin, calcium carbonate-vit d-min, clotrimazole-betamethasone, cyanocobalamin, escitalopram, fluticasone, hydrocodone-acetaminophen, loratadine, meloxicam, metronidazole, multivitamin, omega 3, omeprazole, and perphenazine. Also, is allergic to flax [bio-flax]; ginger; sulfa antibiotics; trileptal [oxcarbazepine]; and latex.  Review of Systems  All other systems reviewed and are negative.   Objective: BP 130/90   Pulse 80   Ht 5' (1.524 m)   Wt 202 lb (91.6 kg)   BMI 39.45 kg/m  Physical Exam  Constitutional: She is  oriented to person, place, and time. She appears well-developed and well-nourished. No distress.  Genitourinary: Vagina normal and uterus normal. Pelvic exam was performed with patient supine. There is no rash, tenderness or lesion on the right labia. There is no rash, tenderness or lesion on the left labia. No erythema or bleeding in the vagina. Right adnexum does not display mass and does not display tenderness. Left adnexum does not display mass and does not display tenderness. Cervix does not exhibit motion tenderness, discharge, polyp or nabothian cyst.   Uterus is mobile and midaxial. Uterus is not enlarged or exhibiting a mass.  Abdominal: Soft. She exhibits no distension. There is no tenderness.  Musculoskeletal: Normal range of motion.  Neurological: She is alert and oriented to person, place, and time. No cranial nerve deficit.  Skin: Skin is warm and dry.  Psychiatric: She has a normal mood and affect.   WET PREP:   positive clue cells and positive WBC's  Findings are consistent with bacterial vaginosis  ASSESSMENT/PLAN:    Problem List Items Addressed This Visit    None    Visit Diagnoses    Subacute vaginitis    -  Primary   Vaginal atrophy        Treat w Metrogel for internal BV Treat w Lotrisone PRN for ext sx's of burning or rash Consider Estrogen Vaginal Therapy if burning persists despite Metrogel therapy as exam c/w atrophy  Barnett Applebaum, MD, [facility], [facility] 08/11/2017  3:05 PM

## 2017-08-14 DIAGNOSIS — J0101 Acute recurrent maxillary sinusitis: Secondary | ICD-10-CM | POA: Diagnosis not present

## 2017-08-27 ENCOUNTER — Ambulatory Visit
Admission: RE | Admit: 2017-08-27 | Discharge: 2017-08-27 | Disposition: A | Discharge status: Home / Self Care | Payer: Medicare HMO | Source: Ambulatory Visit | Attending: Surgery | Admitting: Surgery

## 2017-08-27 ENCOUNTER — Other Ambulatory Visit: Payer: Self-pay | Admitting: Surgery

## 2017-08-27 DIAGNOSIS — X58XXXD Exposure to other specified factors, subsequent encounter: Secondary | ICD-10-CM | POA: Diagnosis not present

## 2017-08-27 DIAGNOSIS — M1712 Unilateral primary osteoarthritis, left knee: Secondary | ICD-10-CM | POA: Insufficient documentation

## 2017-08-27 DIAGNOSIS — S83231D Complex tear of medial meniscus, current injury, right knee, subsequent encounter: Secondary | ICD-10-CM | POA: Diagnosis not present

## 2017-08-27 DIAGNOSIS — M7989 Other specified soft tissue disorders: Secondary | ICD-10-CM | POA: Diagnosis not present

## 2017-09-04 DIAGNOSIS — J0101 Acute recurrent maxillary sinusitis: Secondary | ICD-10-CM | POA: Diagnosis not present

## 2017-09-25 DIAGNOSIS — J0101 Acute recurrent maxillary sinusitis: Secondary | ICD-10-CM | POA: Diagnosis not present

## 2017-10-13 DIAGNOSIS — F209 Schizophrenia, unspecified: Secondary | ICD-10-CM | POA: Diagnosis not present

## 2017-10-16 ENCOUNTER — Other Ambulatory Visit: Payer: Self-pay | Admitting: Unknown Physician Specialty

## 2017-10-16 DIAGNOSIS — J329 Chronic sinusitis, unspecified: Secondary | ICD-10-CM | POA: Diagnosis not present

## 2017-10-16 DIAGNOSIS — J324 Chronic pansinusitis: Secondary | ICD-10-CM

## 2017-10-16 DIAGNOSIS — R51 Headache: Secondary | ICD-10-CM | POA: Diagnosis not present

## 2017-10-16 DIAGNOSIS — J309 Allergic rhinitis, unspecified: Secondary | ICD-10-CM | POA: Diagnosis not present

## 2017-10-23 DIAGNOSIS — Z0100 Encounter for examination of eyes and vision without abnormal findings: Secondary | ICD-10-CM | POA: Diagnosis not present

## 2017-10-23 DIAGNOSIS — J301 Allergic rhinitis due to pollen: Secondary | ICD-10-CM | POA: Diagnosis not present

## 2017-10-23 DIAGNOSIS — E282 Polycystic ovarian syndrome: Secondary | ICD-10-CM | POA: Diagnosis not present

## 2017-10-28 ENCOUNTER — Ambulatory Visit
Admission: RE | Admit: 2017-10-28 | Discharge: 2017-10-28 | Disposition: A | Discharge status: Home / Self Care | Payer: Medicare HMO | Source: Ambulatory Visit | Attending: Unknown Physician Specialty | Admitting: Unknown Physician Specialty

## 2017-10-28 DIAGNOSIS — J324 Chronic pansinusitis: Secondary | ICD-10-CM

## 2017-10-28 DIAGNOSIS — J329 Chronic sinusitis, unspecified: Secondary | ICD-10-CM | POA: Insufficient documentation

## 2017-10-30 DIAGNOSIS — J309 Allergic rhinitis, unspecified: Secondary | ICD-10-CM | POA: Diagnosis not present

## 2017-10-30 DIAGNOSIS — Z0000 Encounter for general adult medical examination without abnormal findings: Secondary | ICD-10-CM | POA: Diagnosis not present

## 2017-10-30 DIAGNOSIS — F250 Schizoaffective disorder, bipolar type: Secondary | ICD-10-CM | POA: Diagnosis not present

## 2017-10-30 DIAGNOSIS — E282 Polycystic ovarian syndrome: Secondary | ICD-10-CM | POA: Diagnosis not present

## 2017-11-06 DIAGNOSIS — J329 Chronic sinusitis, unspecified: Secondary | ICD-10-CM | POA: Diagnosis not present

## 2017-11-06 DIAGNOSIS — J309 Allergic rhinitis, unspecified: Secondary | ICD-10-CM | POA: Diagnosis not present

## 2017-11-07 DIAGNOSIS — M23204 Derangement of unspecified medial meniscus due to old tear or injury, left knee: Secondary | ICD-10-CM | POA: Diagnosis not present

## 2017-11-07 DIAGNOSIS — M1712 Unilateral primary osteoarthritis, left knee: Secondary | ICD-10-CM | POA: Diagnosis not present

## 2017-11-10 ENCOUNTER — Other Ambulatory Visit: Payer: Self-pay | Admitting: Internal Medicine

## 2017-11-10 DIAGNOSIS — Z1231 Encounter for screening mammogram for malignant neoplasm of breast: Secondary | ICD-10-CM

## 2017-11-27 DIAGNOSIS — J328 Other chronic sinusitis: Secondary | ICD-10-CM | POA: Diagnosis not present

## 2017-12-11 ENCOUNTER — Ambulatory Visit
Admission: RE | Admit: 2017-12-11 | Discharge: 2017-12-11 | Disposition: A | Discharge status: Home / Self Care | Payer: Medicare HMO | Source: Ambulatory Visit | Attending: Internal Medicine | Admitting: Internal Medicine

## 2017-12-11 DIAGNOSIS — Z1231 Encounter for screening mammogram for malignant neoplasm of breast: Secondary | ICD-10-CM | POA: Diagnosis not present

## 2017-12-21 IMAGING — MG MM DIGITAL SCREENING BILAT W/ TOMO W/ CAD
8 of 12 series · 8 of 28 positions shown · non-contrast
Comparison: Previous exam(s).

CLINICAL DATA: Screening.

EXAM:
2D DIGITAL SCREENING BILATERAL MAMMOGRAM WITH CAD AND ADJUNCT TOMO

[L CC synth-2D]
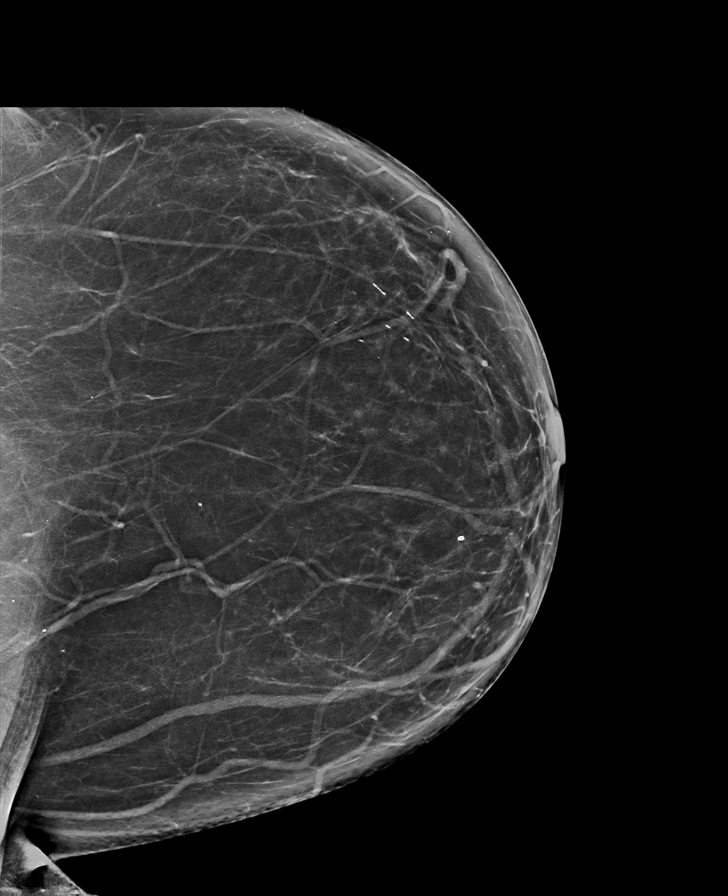

[L MLO]
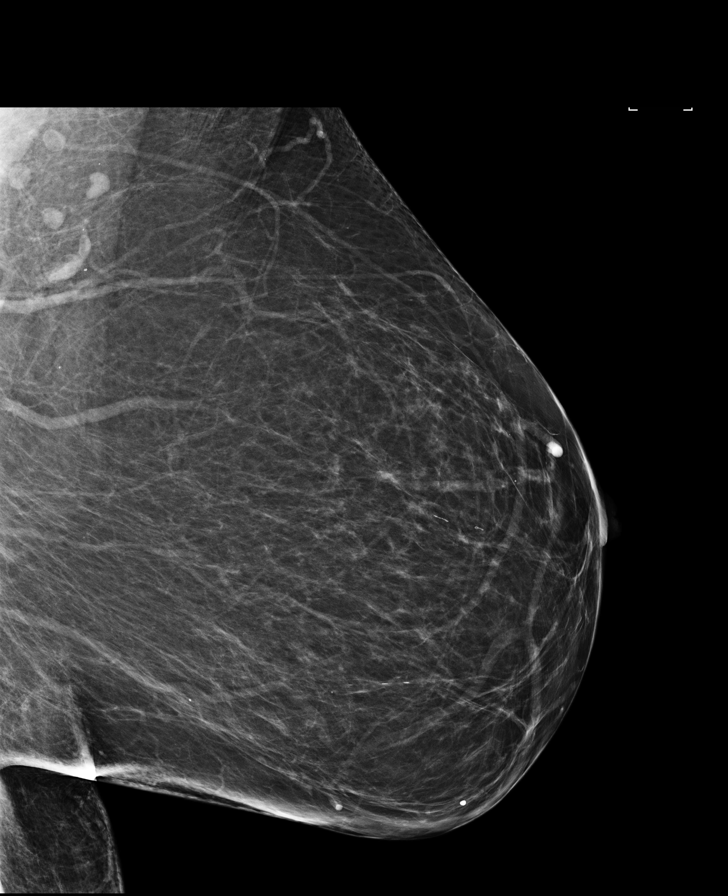

[R CC]
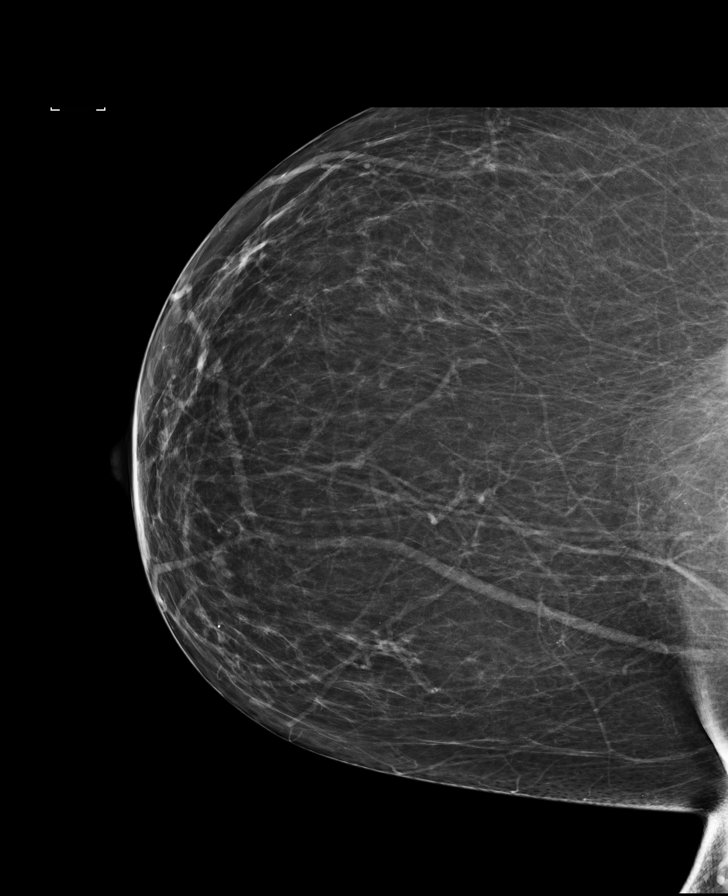

[R MLO synth-2D]
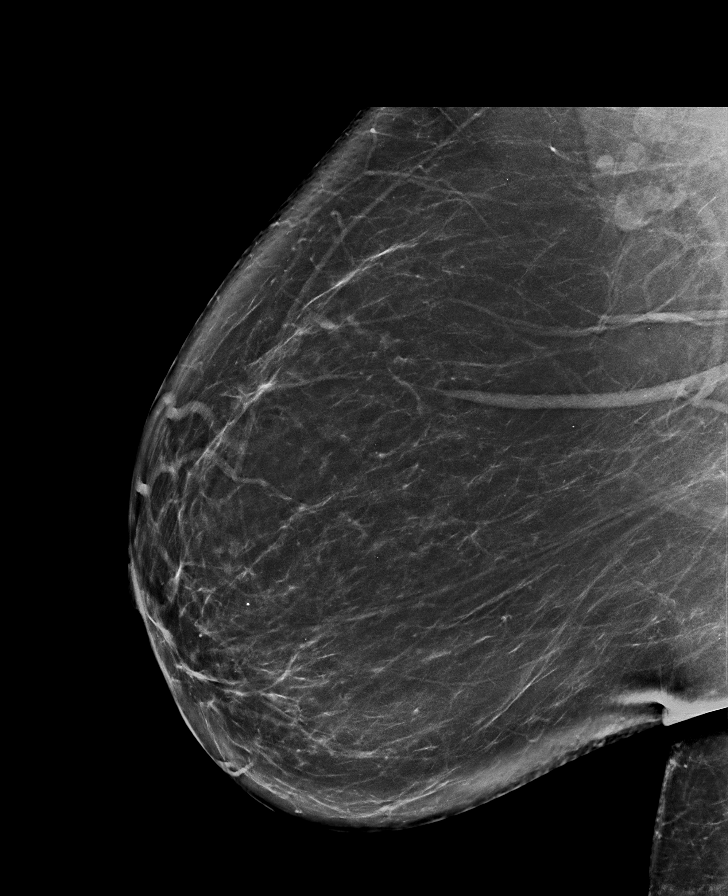

[L CC]
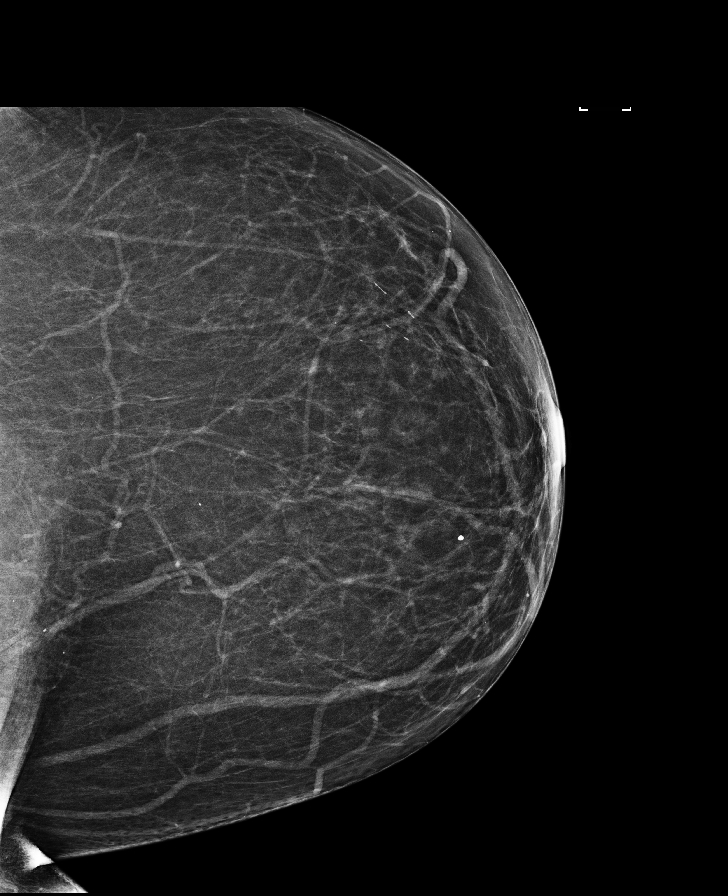

[L MLO synth-2D]
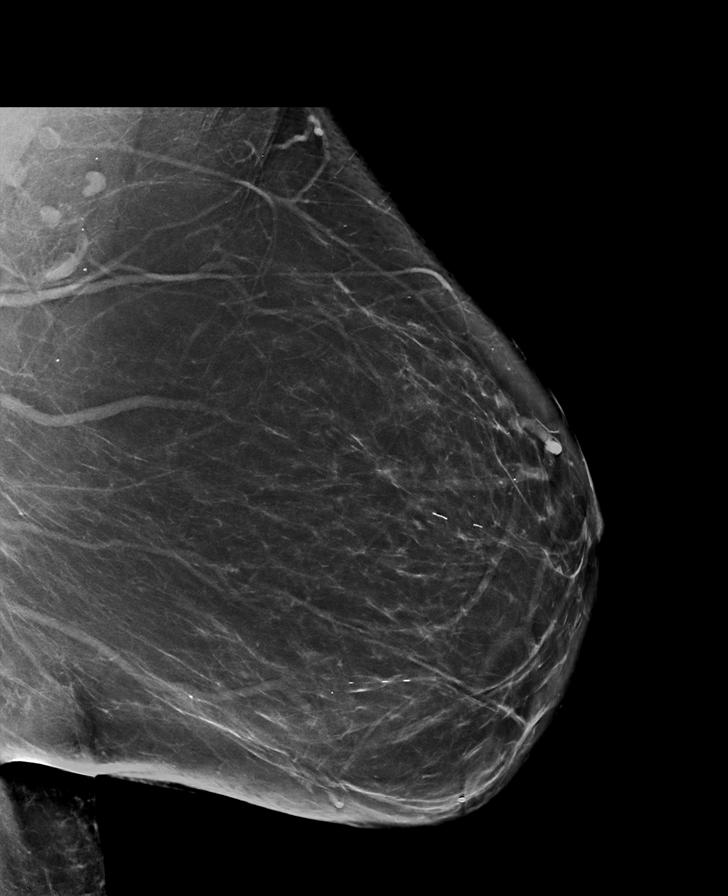

[R CC synth-2D]
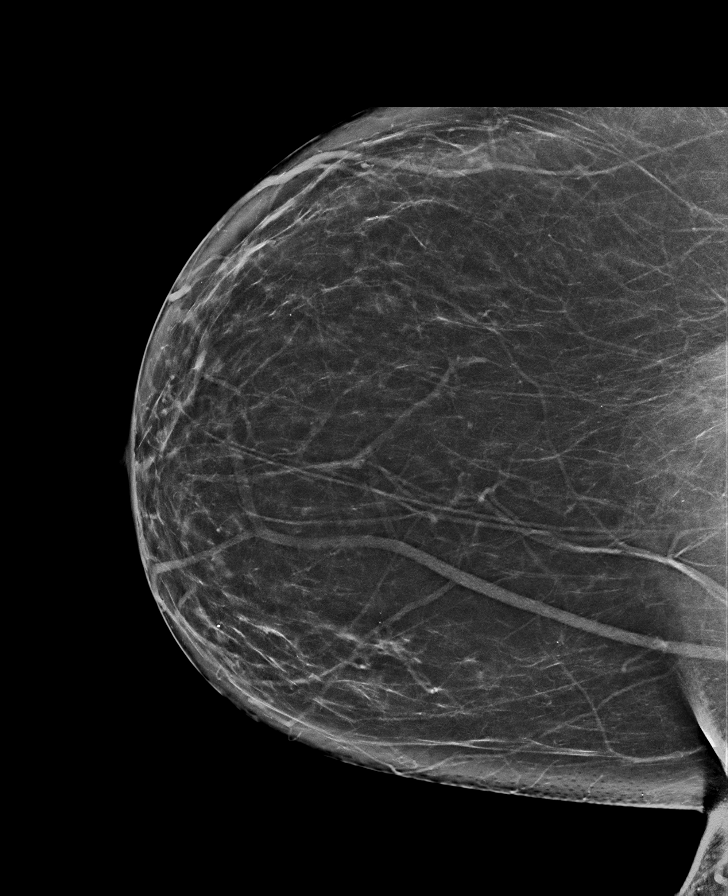

[R MLO]
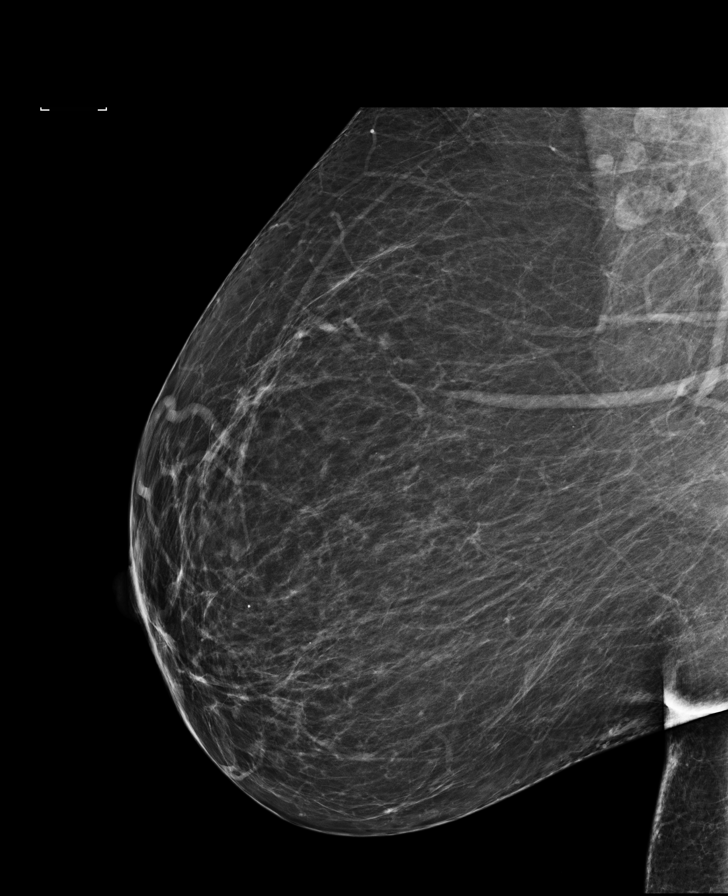

[8 of 28 positions shown; findings below may reference images not displayed]

ACR Breast Density Category b: There are scattered areas of
fibroglandular density.
FINDINGS: There are no findings suspicious for malignancy. Images were
processed with CAD.
IMPRESSION: No mammographic evidence of malignancy. A result letter of this
screening mammogram will be mailed directly to the patient.

RECOMMENDATION:
Screening mammogram in one year. (Code:97-6-RS4)

BI-RADS CATEGORY  1: Negative.

## 2018-01-09 DIAGNOSIS — J0111 Acute recurrent frontal sinusitis: Secondary | ICD-10-CM | POA: Diagnosis not present

## 2018-01-09 DIAGNOSIS — F209 Schizophrenia, unspecified: Secondary | ICD-10-CM | POA: Diagnosis not present

## 2018-01-09 DIAGNOSIS — R51 Headache: Secondary | ICD-10-CM | POA: Diagnosis not present

## 2018-01-13 ENCOUNTER — Encounter: Payer: Self-pay | Admitting: Obstetrics & Gynecology

## 2018-01-15 ENCOUNTER — Ambulatory Visit (INDEPENDENT_AMBULATORY_CARE_PROVIDER_SITE_OTHER): Payer: Medicare HMO | Admitting: Obstetrics & Gynecology

## 2018-01-15 ENCOUNTER — Encounter: Payer: Self-pay | Admitting: Obstetrics & Gynecology

## 2018-01-15 VITALS — BP 124/88 | HR 66 | Ht 60.0 in | Wt 202.0 lb

## 2018-01-15 DIAGNOSIS — Z0000 Encounter for general adult medical examination without abnormal findings: Secondary | ICD-10-CM | POA: Insufficient documentation

## 2018-01-15 DIAGNOSIS — Z1231 Encounter for screening mammogram for malignant neoplasm of breast: Secondary | ICD-10-CM

## 2018-01-15 DIAGNOSIS — Z1211 Encounter for screening for malignant neoplasm of colon: Secondary | ICD-10-CM

## 2018-01-15 DIAGNOSIS — Z124 Encounter for screening for malignant neoplasm of cervix: Secondary | ICD-10-CM

## 2018-01-15 DIAGNOSIS — Z1239 Encounter for other screening for malignant neoplasm of breast: Secondary | ICD-10-CM

## 2018-01-15 DIAGNOSIS — Z01419 Encounter for gynecological examination (general) (routine) without abnormal findings: Secondary | ICD-10-CM

## 2018-01-15 MED ORDER — CLOTRIMAZOLE-BETAMETHASONE 1-0.05 % EX CREA: 1.0000 "application " | TOPICAL_CREAM | Freq: Two times a day (BID) | CUTANEOUS | 2 refills | Status: DC | PRN

## 2018-01-15 NOTE — Patient Instructions (Signed)
PAP every three years Mammogram every year (due December)    Call [phone] to schedule at [facility] Colonoscopy every 10 years Labs yearly (with PCP)

## 2018-01-15 NOTE — Progress Notes (Signed)
HPI:      [name] is a 62 y.o. [id number] who LMP was in the past, she presents today for her annual examination.  The patient has no complaints today. The patient is not sexually active. Herlast pap: approximate date 2016 and was normal and last mammogram: approximate date 11/2017 and was normal.  The patient does perform self breast exams.  There is no notable family history of breast or ovarian cancer in her family. The patient is not taking hormone replacement therapy. Patient denies post-menopausal vaginal bleeding.   The patient has regular exercise: yes. The patient denies current symptoms of depression.    GYN Hx: Last Colonoscopy:10 years ago. Normal.  Last DEXA: 2 years ago.    PMHx: Past Medical History:  Diagnosis Date  . Arthritis    knee  . Depression   . GERD (gastroesophageal reflux disease)   . Headache    thinks may have been from tick bite.  None since antibiotic  . Polycystic ovarian syndrome   . Schizophrenia ([place])   . Tick bite    approx 2 wks ago.  has finished round of doxycycline   Past Surgical History:  Procedure Laterality Date  . CESAREAN SECTION    . 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  Marland Kitchen DIAGNOSTIC LAPAROSCOPY  1987  . DILATION AND CURETTAGE OF UTERUS  approx 2004   [facility]  . ESOPHAGOGASTRODUODENOSCOPY (EGD) WITH PROPOFOL N/A 05/15/2016   Procedure: ESOPHAGOGASTRODUODENOSCOPY (EGD) WITH PROPOFOL;  Surgeon: Manya Silvas, MD;  Location: [facility] ENDOSCOPY;  Service: Endoscopy;  Laterality: N/A;  . KNEE ARTHROSCOPY Right 07/17/2016   Procedure: ARTHROSCOPY KNEE WITH DEBRIDEMENT AND PARTIAL MEDIAL MENISCECTOMY right knee;  Surgeon: Corky Mull, MD;  Location: [place];  Service: Orthopedics;   Laterality: Right;  . KNEE ARTHROSCOPY Left 07/09/2017   Procedure: ARTHROSCOPY KNEE  with debridement and medial meniscectomy;  Surgeon: Corky Mull, MD;  Location: [place];  Service: Orthopedics;  Laterality: Left;  . NOSE SURGERY    . OVARIAN CYST REMOVAL  1986   Family History  Adopted: Yes  Problem Relation Age of Onset  . Pancreatic cancer Mother    Social History   Tobacco Use  . Smoking status: Never Smoker  . Smokeless tobacco: Never Used  Substance Use Topics  . Alcohol use: No  . Drug use: No    Current Outpatient Medications:  .  alendronate (FOSAMAX) 70 MG tablet, Take 70 mg by mouth once a week. Take with a full glass of water on an empty stomach., Disp: , Rfl:  .  azithromycin (ZITHROMAX) 250 MG tablet, Take by mouth daily., Disp: , Rfl:  .  Biotin 1 MG CAPS, Take by mouth., Disp: , Rfl:  .  Calcium Carbonate-Vit D-Min (CALCIUM 1200 PO), Take 2,400 mg by mouth daily., Disp: , Rfl:  .  clotrimazole-betamethasone (LOTRISONE) cream, Apply 1 application topically 2 (two) times daily as needed., Disp: 45 g, Rfl: 1 .  cyanocobalamin 1000 MCG tablet, Take 1,000 mcg by mouth daily., Disp: , Rfl:  .  escitalopram (LEXAPRO) 10 MG tablet, Take 10 mg by mouth daily., Disp: , Rfl:  .  fluticasone (FLONASE) 50 MCG/ACT nasal spray, Place into both nostrils daily., Disp: , Rfl:  .  HYDROcodone-acetaminophen (NORCO) 5-325  MG tablet, Take 1-2 tablets by mouth every 4 (four) hours as needed for moderate pain. MAXIMUM TOTAL ACETAMINOPHEN DOSE IS 4000 MG PER DAY, Disp: 40 tablet, Rfl: 0 .  loratadine (CLARITIN) 10 MG tablet, Take 10 mg by mouth daily., Disp: , Rfl:  .  meloxicam (MOBIC) 7.5 MG tablet, Take 7.5 mg by mouth daily., Disp: , Rfl:  .  Multiple Vitamin (MULTIVITAMIN) capsule, Take 1 capsule by mouth daily., Disp: , Rfl:  .  Omega 3 340 MG CPDR, Take by mouth daily., Disp: , Rfl:  .  omeprazole (PRILOSEC) 40 MG capsule, Take 40 mg by mouth daily., Disp: , Rfl:  .   perphenazine (TRILAFON) 8 MG tablet, Take 8 mg by mouth 2 (two) times daily., Disp: , Rfl:  Allergies: Oxcarbazepine; Cefuroxime; Citric acid; Eggs or egg-derived products; Flax [bio-flax]; Ginger; Lac bovis; Prednisone; Sulfa antibiotics; Wheat bran; and Latex  Review of Systems  Constitutional: Negative for chills, fever and malaise/fatigue.  HENT: Negative for congestion, sinus pain and sore throat.   Eyes: Negative for blurred vision and pain.  Respiratory: Negative for cough and wheezing.   Cardiovascular: Negative for chest pain and leg swelling.  Gastrointestinal: Negative for abdominal pain, constipation, diarrhea, heartburn, nausea and vomiting.  Genitourinary: Negative for dysuria, frequency, hematuria and urgency.  Musculoskeletal: Negative for back pain, joint pain, myalgias and neck pain.  Skin: Negative for itching and rash.  Neurological: Negative for dizziness, tremors and weakness.  Endo/Heme/Allergies: Does not bruise/bleed easily.  Psychiatric/Behavioral: Negative for depression. The patient is not nervous/anxious and does not have insomnia.    Objective: BP 124/88   Pulse 66   Ht 5' (1.524 m)   Wt 202 lb (91.6 kg)   BMI 39.45 kg/m   Filed Weights   01/15/18 0816  Weight: 202 lb (91.6 kg)   Body mass index is 39.45 kg/m. Physical Exam  Constitutional: She is oriented to person, place, and time. She appears well-developed and well-nourished. No distress.  Genitourinary: Rectum normal, vagina normal and uterus normal. Pelvic exam was performed with patient supine. There is no rash or lesion on the right labia. There is no rash or lesion on the left labia. Vagina exhibits no lesion. No bleeding in the vagina. Right adnexum does not display mass and does not display tenderness. Left adnexum does not display mass and does not display tenderness. Cervix does not exhibit motion tenderness, lesion, friability or polyp.   Uterus is mobile and midaxial. Uterus is not  enlarged or exhibiting a mass.  HENT:  Head: Normocephalic and atraumatic. Head is without laceration.  Right Ear: Hearing normal.  Left Ear: Hearing normal.  Nose: No epistaxis.  No foreign bodies.  Mouth/Throat: Uvula is midline, oropharynx is clear and moist and mucous membranes are normal.  Eyes: Pupils are equal, round, and reactive to light.  Neck: Normal range of motion. Neck supple. No thyromegaly present.  Cardiovascular: Normal rate and regular rhythm. Exam reveals no gallop and no friction rub.  No murmur heard. Pulmonary/Chest: Effort normal and breath sounds normal. No respiratory distress. She has no wheezes. Right breast exhibits no mass, no skin change and no tenderness. Left breast exhibits no mass, no skin change and no tenderness.  Abdominal: Soft. Bowel sounds are normal. She exhibits no distension. There is no tenderness. There is no rebound.  Musculoskeletal: Normal range of motion.  Neurological: She is alert and oriented to person, place, and time. No cranial nerve deficit.  Skin: Skin is warm and  dry.  Psychiatric: She has a normal mood and affect. Judgment normal.  Vitals reviewed.  Assessment: Annual Exam 1. Annual physical exam   2. Screening for cervical cancer   3. Screening for breast cancer   4. Screen for colon cancer    Plan:            1.  Cervical Screening-  Pap smear done today  2. Breast screening- Exam annually and mammogram scheduled (done Dec 2018)  3. Colonoscopy every 10 years, Hemoccult testing after age 42  4. Labs managed by PCP  5. Counseling for hormonal therapy: none, no change in therapy today  6. OAB, monitor sx's for now  7. Vag dryness, vulvar sx's; cont PRN Lotrisone    F/U  Return in about 1 year (around 01/15/2019) for Annual.  Barnett Applebaum, MD, [facility], [facility] 01/15/2018  8:18 AM

## 2018-01-17 LAB — IGP, APTIMA HPV
HPV APTIMA: NEGATIVE
PAP SMEAR COMMENT: 0

## 2018-02-25 DIAGNOSIS — D1801 Hemangioma of skin and subcutaneous tissue: Secondary | ICD-10-CM | POA: Diagnosis not present

## 2018-02-25 DIAGNOSIS — L700 Acne vulgaris: Secondary | ICD-10-CM | POA: Diagnosis not present

## 2018-02-25 DIAGNOSIS — L812 Freckles: Secondary | ICD-10-CM | POA: Diagnosis not present

## 2018-02-25 DIAGNOSIS — D485 Neoplasm of uncertain behavior of skin: Secondary | ICD-10-CM | POA: Diagnosis not present

## 2018-02-25 DIAGNOSIS — L820 Inflamed seborrheic keratosis: Secondary | ICD-10-CM | POA: Diagnosis not present

## 2018-02-25 DIAGNOSIS — L821 Other seborrheic keratosis: Secondary | ICD-10-CM | POA: Diagnosis not present

## 2018-02-25 DIAGNOSIS — L719 Rosacea, unspecified: Secondary | ICD-10-CM | POA: Diagnosis not present

## 2018-02-25 DIAGNOSIS — L304 Erythema intertrigo: Secondary | ICD-10-CM | POA: Diagnosis not present

## 2018-02-25 DIAGNOSIS — D225 Melanocytic nevi of trunk: Secondary | ICD-10-CM | POA: Diagnosis not present

## 2018-02-26 DIAGNOSIS — J0191 Acute recurrent sinusitis, unspecified: Secondary | ICD-10-CM | POA: Diagnosis not present

## 2018-03-19 DIAGNOSIS — Z9109 Other allergy status, other than to drugs and biological substances: Secondary | ICD-10-CM | POA: Diagnosis not present

## 2018-03-19 DIAGNOSIS — M26621 Arthralgia of right temporomandibular joint: Secondary | ICD-10-CM | POA: Diagnosis not present

## 2018-03-19 DIAGNOSIS — L719 Rosacea, unspecified: Secondary | ICD-10-CM | POA: Diagnosis not present

## 2018-03-19 DIAGNOSIS — L700 Acne vulgaris: Secondary | ICD-10-CM | POA: Diagnosis not present

## 2018-03-19 DIAGNOSIS — L304 Erythema intertrigo: Secondary | ICD-10-CM | POA: Diagnosis not present

## 2018-03-19 DIAGNOSIS — L309 Dermatitis, unspecified: Secondary | ICD-10-CM | POA: Diagnosis not present

## 2018-03-26 DIAGNOSIS — J329 Chronic sinusitis, unspecified: Secondary | ICD-10-CM | POA: Diagnosis not present

## 2018-03-26 DIAGNOSIS — H6123 Impacted cerumen, bilateral: Secondary | ICD-10-CM | POA: Diagnosis not present

## 2018-03-26 DIAGNOSIS — J301 Allergic rhinitis due to pollen: Secondary | ICD-10-CM | POA: Diagnosis not present

## 2018-05-02 DIAGNOSIS — R509 Fever, unspecified: Secondary | ICD-10-CM | POA: Diagnosis not present

## 2018-05-02 DIAGNOSIS — W57XXXA Bitten or stung by nonvenomous insect and other nonvenomous arthropods, initial encounter: Secondary | ICD-10-CM | POA: Diagnosis not present

## 2018-05-02 DIAGNOSIS — S1086XA Insect bite of other specified part of neck, initial encounter: Secondary | ICD-10-CM | POA: Diagnosis not present

## 2018-06-06 DIAGNOSIS — R51 Headache: Secondary | ICD-10-CM | POA: Diagnosis not present

## 2018-06-06 DIAGNOSIS — R21 Rash and other nonspecific skin eruption: Secondary | ICD-10-CM | POA: Diagnosis not present

## 2018-07-10 DIAGNOSIS — L740 Miliaria rubra: Secondary | ICD-10-CM | POA: Diagnosis not present

## 2018-07-10 DIAGNOSIS — J029 Acute pharyngitis, unspecified: Secondary | ICD-10-CM | POA: Diagnosis not present

## 2018-07-15 DIAGNOSIS — F209 Schizophrenia, unspecified: Secondary | ICD-10-CM | POA: Diagnosis not present

## 2018-07-16 DIAGNOSIS — B349 Viral infection, unspecified: Secondary | ICD-10-CM | POA: Diagnosis not present

## 2018-08-13 DIAGNOSIS — S20162A Insect bite (nonvenomous) of breast, left breast, initial encounter: Secondary | ICD-10-CM | POA: Diagnosis not present

## 2018-08-13 DIAGNOSIS — W57XXXA Bitten or stung by nonvenomous insect and other nonvenomous arthropods, initial encounter: Secondary | ICD-10-CM | POA: Diagnosis not present

## 2018-08-13 DIAGNOSIS — R21 Rash and other nonspecific skin eruption: Secondary | ICD-10-CM | POA: Diagnosis not present

## 2018-09-24 ENCOUNTER — Encounter: Payer: Self-pay | Admitting: Obstetrics and Gynecology

## 2018-09-24 ENCOUNTER — Ambulatory Visit: Payer: Medicare HMO | Admitting: Obstetrics and Gynecology

## 2018-09-24 VITALS — BP 120/78 | HR 96 | Ht 60.0 in | Wt 205.0 lb

## 2018-09-24 DIAGNOSIS — L719 Rosacea, unspecified: Secondary | ICD-10-CM | POA: Diagnosis not present

## 2018-09-24 DIAGNOSIS — L700 Acne vulgaris: Secondary | ICD-10-CM | POA: Diagnosis not present

## 2018-09-24 DIAGNOSIS — D2339 Other benign neoplasm of skin of other parts of face: Secondary | ICD-10-CM | POA: Diagnosis not present

## 2018-09-24 DIAGNOSIS — L280 Lichen simplex chronicus: Secondary | ICD-10-CM

## 2018-09-24 MED ORDER — CLOBETASOL PROPIONATE 0.05 % EX OINT: TOPICAL_OINTMENT | CUTANEOUS | 5 refills | Status: DC

## 2018-09-24 NOTE — Progress Notes (Signed)
Patient ID: Samantha Dean, female   DOB: Nov 12, 1956, 62 y.o.   MRN: [id number]  Reason for Consult: Vaginitis (itching,burning, some discharge, on left side of labia there is a leison. having some hot flashes )   Referred by No ref. provider found  Subjective:     HPI:  Samantha Dean is a 62 y.o. female  itching,burning, some discharge, on left side of labia there is a leison. having some hot flashes  Past Medical History:  Diagnosis Date  . Arthritis    knee  . Depression   . GERD (gastroesophageal reflux disease)   . Headache    thinks may have been from tick bite.  None since antibiotic  . Polycystic ovarian syndrome   . Schizophrenia ([place])   . Tick bite    approx 2 wks ago.  has finished round of doxycycline   Family History  Adopted: Yes  Problem Relation Age of Onset  . Pancreatic cancer Mother    Past Surgical History:  Procedure Laterality Date  . CESAREAN SECTION    . 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  Marland Kitchen DIAGNOSTIC LAPAROSCOPY  1987  . DILATION AND CURETTAGE OF UTERUS  approx 2004   [facility]  . ESOPHAGOGASTRODUODENOSCOPY (EGD) WITH PROPOFOL N/A 05/15/2016   Procedure: ESOPHAGOGASTRODUODENOSCOPY (EGD) WITH PROPOFOL;  Surgeon: Manya Silvas, MD;  Location: [facility] ENDOSCOPY;  Service: Endoscopy;  Laterality: N/A;  . KNEE ARTHROSCOPY Right 07/17/2016   Procedure: ARTHROSCOPY KNEE WITH DEBRIDEMENT AND PARTIAL MEDIAL MENISCECTOMY right knee;  Surgeon: Corky Mull, MD;  Location: [place];  Service: Orthopedics;  Laterality: Right;  . KNEE ARTHROSCOPY Left 07/09/2017   Procedure: ARTHROSCOPY KNEE  with debridement and medial meniscectomy;  Surgeon: Corky Mull, MD;  Location: [place];  Service: Orthopedics;   Laterality: Left;  . NOSE SURGERY    . OVARIAN CYST REMOVAL  1986    Short Social History:  Social History   Tobacco Use  . Smoking status: Never Smoker  . Smokeless tobacco: Never Used  Substance Use Topics  . Alcohol use: No    Allergies  Allergen Reactions  . Oxcarbazepine Swelling    angioedema  . Cefuroxime Swelling  . Citric Acid Other (See Comments)  . Eggs Or Egg-Derived Products Other (See Comments)  . Flax [Bio-Flax] Hives  . Ginger Hives  . Lac Bovis Other (See Comments)  . Prednisone   . Sulfa Antibiotics Hives  . Wheat Bran Other (See Comments)  . Latex Rash    bandaids only, no known issue with gloves, condoms, balloons or underwear elastic    Current Outpatient Medications  Medication Sig Dispense Refill  . alendronate (FOSAMAX) 70 MG tablet Take 70 mg by mouth once a week. Take with a full glass of water on an empty stomach.    Marland Kitchen azelastine (ASTELIN) 0.1 % nasal spray Place into the nose.    . Biotin 1 MG CAPS Take by mouth.    . Calcium Carbonate-Vit D-Min (CALCIUM 1200 PO) Take 2,400 mg by mouth daily.    . cyanocobalamin 1000 MCG tablet Take 1,000 mcg by mouth daily.    Marland Kitchen escitalopram (LEXAPRO) 10 MG tablet Take 10 mg by mouth daily.    . fluticasone (FLONASE) 50 MCG/ACT nasal spray Place into both  nostrils daily.    Marland Kitchen loratadine (CLARITIN) 10 MG tablet Take 10 mg by mouth daily.    . meloxicam (MOBIC) 15 MG tablet   0  . metroNIDAZOLE (METROCREAM) 0.75 % cream APP THIN LAYER TO FACE BID    . Multiple Vitamin (MULTIVITAMIN) capsule Take 1 capsule by mouth daily.    . Omega 3 340 MG CPDR Take by mouth daily.    Marland Kitchen perphenazine (TRILAFON) 2 MG tablet Take by mouth.    . tretinoin (RETIN-A) 0.1 % cream     . clobetasol ointment (TEMOVATE) 0.05 % Apply to affected area every night for 6 weeks, then every other day for 6 weeks. 30 g 5   No current facility-administered medications for this visit.     REVIEW OF SYSTEMS      Objective:  Objective    Vitals:   09/24/18 1405  BP: 120/78  Pulse: 96  Weight: 205 lb (93 kg)  Height: 5' (1.524 m)   Body mass index is 40.04 kg/m.  Physical Exam      Assessment/Plan:    Appearance consistent with lichen simplex chronicus Trial clobetasol cream Biopsy of there is no improvement.   Adrian Prows MD, [facility], [facility] 04/19/2021 9:22 PM

## 2018-10-23 DIAGNOSIS — J019 Acute sinusitis, unspecified: Secondary | ICD-10-CM | POA: Diagnosis not present

## 2018-10-29 DIAGNOSIS — Z0000 Encounter for general adult medical examination without abnormal findings: Secondary | ICD-10-CM | POA: Diagnosis not present

## 2018-11-05 ENCOUNTER — Encounter: Payer: Self-pay | Admitting: Obstetrics and Gynecology

## 2018-11-05 ENCOUNTER — Other Ambulatory Visit: Payer: Self-pay | Admitting: Internal Medicine

## 2018-11-05 ENCOUNTER — Ambulatory Visit: Payer: Medicare HMO | Admitting: Obstetrics and Gynecology

## 2018-11-05 ENCOUNTER — Ambulatory Visit (INDEPENDENT_AMBULATORY_CARE_PROVIDER_SITE_OTHER): Payer: Medicare HMO | Admitting: Obstetrics and Gynecology

## 2018-11-05 VITALS — BP 120/80 | HR 78 | Ht 60.0 in | Wt 206.5 lb

## 2018-11-05 DIAGNOSIS — Z0000 Encounter for general adult medical examination without abnormal findings: Secondary | ICD-10-CM | POA: Diagnosis not present

## 2018-11-05 DIAGNOSIS — J019 Acute sinusitis, unspecified: Secondary | ICD-10-CM | POA: Diagnosis not present

## 2018-11-05 DIAGNOSIS — Z1239 Encounter for other screening for malignant neoplasm of breast: Secondary | ICD-10-CM

## 2018-11-05 DIAGNOSIS — M1712 Unilateral primary osteoarthritis, left knee: Secondary | ICD-10-CM | POA: Diagnosis not present

## 2018-11-05 DIAGNOSIS — Z0001 Encounter for general adult medical examination with abnormal findings: Secondary | ICD-10-CM | POA: Diagnosis not present

## 2018-11-05 DIAGNOSIS — L280 Lichen simplex chronicus: Secondary | ICD-10-CM

## 2018-11-05 DIAGNOSIS — F250 Schizoaffective disorder, bipolar type: Secondary | ICD-10-CM | POA: Diagnosis not present

## 2018-11-05 DIAGNOSIS — E282 Polycystic ovarian syndrome: Secondary | ICD-10-CM | POA: Diagnosis not present

## 2018-11-05 DIAGNOSIS — Z1231 Encounter for screening mammogram for malignant neoplasm of breast: Secondary | ICD-10-CM

## 2018-11-05 NOTE — Progress Notes (Signed)
Patient ID: Samantha Dean, female   DOB: 1956/08/03, 62 y.o.   MRN: [id number]  Reason for Consult: Follow-up (Has healed, would like breast exam done, nothing concerning just would like one)   Referred by No ref. provider found  Subjective:     HPI:  Samantha Dean is a 62 y.o. female. Her bottom is feeling better. She declines a pelvic exam today.     Past Medical History:  Diagnosis Date  . Arthritis    knee  . Depression   . Family history of pancreatic cancer    02/27/21 cancer genetic testing letter sent  . GERD (gastroesophageal reflux disease)   . Headache    thinks may have been from tick bite.  None since antibiotic  . Polycystic ovarian syndrome   . Schizophrenia ([place])   . Tick bite    approx 2 wks ago.  has finished round of doxycycline   Family History  Adopted: Yes  Problem Relation Age of Onset  . Pancreatic cancer Mother   . Breast cancer Neg Hx    Past Surgical History:  Procedure Laterality Date  . CESAREAN SECTION    . 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  Marland Kitchen DIAGNOSTIC LAPAROSCOPY  1987  . DILATION AND CURETTAGE OF UTERUS  approx 2004   [facility]  . ESOPHAGOGASTRODUODENOSCOPY (EGD) WITH PROPOFOL N/A 05/15/2016   Procedure: ESOPHAGOGASTRODUODENOSCOPY (EGD) WITH PROPOFOL;  Surgeon: Manya Silvas, MD;  [facility] ENDOSCOPY;  Service: Endoscopy;  Laterality: N/A;  . KNEE ARTHROSCOPY Right 07/17/2016   Procedure: ARTHROSCOPY KNEE WITH DEBRIDEMENT AND PARTIAL MEDIAL MENISCECTOMY right knee;  Surgeon: Corky Mull, MD;  Location: [place];  Service: Orthopedics;  Laterality: Right;  . KNEE ARTHROSCOPY Left 07/09/2017   Procedure: ARTHROSCOPY KNEE  with debridement and medial meniscectomy;  Surgeon: Corky Mull, MD;  Location: [place];  Service: Orthopedics;  Laterality: Left;  . NOSE SURGERY    . OVARIAN CYST REMOVAL  1986    Short Social History:  Social History   Tobacco Use  . Smoking status: Never Smoker  . Smokeless tobacco: Never Used  Substance Use Topics  . Alcohol use: No    Allergies  Allergen Reactions  . Oxcarbazepine Swelling    angioedema  . Cefuroxime Swelling  . Citric Acid Other (See Comments)  . Eggs Or Egg-Derived Products Other (See Comments)  . Flax [Bio-Flax] Hives  . Ginger Hives  . Lac Bovis Other (See Comments)  . Sulfa Antibiotics Hives  . Wheat Bran Other (See Comments)  . Latex Rash    bandaids only, no known issue with gloves, condoms, balloons or underwear elastic  . Other Rash    Asian tropical fruits    Current Outpatient Medications  Medication Sig Dispense Refill  . alendronate (FOSAMAX) 70 MG tablet Take 70 mg by mouth once a week. Take with a full glass of water on an empty stomach.    Marland Kitchen azelastine (ASTELIN) 0.1 % nasal spray Place into the nose.    . Biotin 1 MG CAPS Take by mouth.    . Calcium Carbonate-Vit D-Min (CALCIUM 1200 PO) Take 2,400 mg by mouth daily.    . cyanocobalamin 1000 MCG tablet Take 1,000 mcg by mouth daily.    Marland Kitchen escitalopram (  LEXAPRO) 20 MG tablet Take by mouth.    . fluticasone (FLONASE) 50 MCG/ACT nasal spray Place into both nostrils daily.    Marland Kitchen loratadine (CLARITIN) 10 MG tablet Take 10 mg by mouth daily.    . meloxicam (MOBIC) 15 MG tablet   0  . metroNIDAZOLE (METROCREAM) 0.75 % cream APP THIN LAYER TO FACE BID    . Multiple Vitamin (MULTIVITAMIN) capsule Take 1 capsule by mouth daily.    . Omega 3 340 MG CPDR Take by mouth daily.    Marland Kitchen perphenazine (TRILAFON) 2 MG tablet Take by mouth.    . tretinoin (RETIN-A) 0.1 % cream     . b complex vitamins tablet Take 1 tablet by mouth daily.    . clotrimazole-betamethasone (LOTRISONE) cream Apply 1 application topically 2 (two) times daily. Use in  affected areas for up to two weeks, then take a break from it. 30 g 11  . doxycycline (VIBRA-TABS) 100 MG tablet  (Patient not taking: Reported on 01/22/2021)    . montelukast (SINGULAIR) 10 MG tablet Take by mouth.    . Zinc Sulfate 66 MG TABS once daily     No current facility-administered medications for this visit.    REVIEW OF SYSTEMS      Objective:  Objective   Vitals:   11/05/18 1544  BP: 120/80  Pulse: 78  Weight: 206 lb 8 oz (93.7 kg)  Height: 5' (1.524 m)   Body mass index is 40.33 kg/m.  Physical Exam  Assessment/Plan:     62 yo following up for lichen simplex chronicus Taper clobetasol to once a week for maintenance. Continue with annual exams. Normal breast exam today.   Adrian Prows MD [facility], [facility] 11/05/18 4:05 PM

## 2018-11-23 DIAGNOSIS — J019 Acute sinusitis, unspecified: Secondary | ICD-10-CM | POA: Diagnosis not present

## 2018-12-03 DIAGNOSIS — R109 Unspecified abdominal pain: Secondary | ICD-10-CM | POA: Diagnosis not present

## 2018-12-03 DIAGNOSIS — R1031 Right lower quadrant pain: Secondary | ICD-10-CM | POA: Diagnosis not present

## 2018-12-04 ENCOUNTER — Ambulatory Visit
Admission: RE | Admit: 2018-12-04 | Discharge: 2018-12-04 | Disposition: A | Discharge status: Home / Self Care | Payer: Medicare HMO | Source: Ambulatory Visit | Attending: Physician Assistant | Admitting: Physician Assistant

## 2018-12-04 ENCOUNTER — Other Ambulatory Visit: Payer: Self-pay | Admitting: Physician Assistant

## 2018-12-04 DIAGNOSIS — R1031 Right lower quadrant pain: Secondary | ICD-10-CM | POA: Insufficient documentation

## 2018-12-04 DIAGNOSIS — R109 Unspecified abdominal pain: Secondary | ICD-10-CM | POA: Insufficient documentation

## 2018-12-17 ENCOUNTER — Ambulatory Visit
Admission: RE | Admit: 2018-12-17 | Discharge: 2018-12-17 | Disposition: A | Discharge status: Home / Self Care | Payer: Medicare HMO | Source: Ambulatory Visit | Attending: Internal Medicine | Admitting: Internal Medicine

## 2018-12-17 DIAGNOSIS — Z1231 Encounter for screening mammogram for malignant neoplasm of breast: Secondary | ICD-10-CM | POA: Diagnosis not present

## 2018-12-28 DIAGNOSIS — Z6841 Body Mass Index (BMI) 40.0 and over, adult: Secondary | ICD-10-CM | POA: Diagnosis not present

## 2018-12-28 DIAGNOSIS — E669 Obesity, unspecified: Secondary | ICD-10-CM | POA: Diagnosis not present

## 2018-12-28 DIAGNOSIS — J329 Chronic sinusitis, unspecified: Secondary | ICD-10-CM | POA: Diagnosis not present

## 2018-12-28 DIAGNOSIS — F250 Schizoaffective disorder, bipolar type: Secondary | ICD-10-CM | POA: Diagnosis not present

## 2018-12-28 DIAGNOSIS — E282 Polycystic ovarian syndrome: Secondary | ICD-10-CM | POA: Diagnosis not present

## 2019-01-06 DIAGNOSIS — M461 Sacroiliitis, not elsewhere classified: Secondary | ICD-10-CM | POA: Diagnosis not present

## 2019-01-14 ENCOUNTER — Encounter: Payer: Self-pay | Admitting: Dietician

## 2019-01-14 ENCOUNTER — Encounter: Payer: Medicare HMO | Attending: Physician Assistant | Admitting: Dietician

## 2019-01-14 VITALS — Ht 60.0 in | Wt 202.4 lb

## 2019-01-14 DIAGNOSIS — E669 Obesity, unspecified: Secondary | ICD-10-CM

## 2019-01-14 DIAGNOSIS — E6609 Other obesity due to excess calories: Secondary | ICD-10-CM | POA: Diagnosis not present

## 2019-01-14 DIAGNOSIS — Z6839 Body mass index (BMI) 39.0-39.9, adult: Secondary | ICD-10-CM

## 2019-01-14 NOTE — Progress Notes (Signed)
Medical Nutrition Therapy: Visit start time: 4132  end time: 1140  Assessment:  Diagnosis: obesity Past medical history: IBS, GERD, PCOS Psychosocial issues/ stress concerns: bipolar schizoaffective disorder, hx of depression  Preferred learning method:  . Auditory . Visual . Hands-on  Current weight: 202.4lbs Height: 5'0" Medications, supplements: reconciled list in medical record  Progress and evaluation: Patient reports overweight/ obesity x multiple years. She has worked on weight loss with various diets in the past, with only short-term success. She is now living with and caring for 2 elderly parents with dementia; has hip and knee pain that increases throughout the week due to general activity. Patient reports GI sensitivity to some foods, including large portions of gluten-containing foods, beans/ legumes. She reports allergies to eggs if eating more than 2 per week, ginger and some other spices, and history of peanut allergy to which she has been mostly desensitized. She continues to limit nuts and peanut butter.   Physical activity: physical housework daily; will begin physical therapy 2x a week.   Dietary Intake:  Usual eating pattern includes 2-3 meals and 1-2 snacks per day. Dining out frequency: 4-5 meals per week.  Breakfast: oatmeal and/or banana; cheerios with almond milk; yogurt, coffee 1-2 lg cups with sugar free creamer Snack: none Lunch: sometimes forgets to eat; recently oodles of noodles (during sinus infection x several weeks); sandwich with deli meat, spinach, swiss or provolone on rye or sandwich thin; gluten free Breton crackers with pim cheese for lunch or snack; protein shake Snack: yogurt, or cheese, crackers, and/or 2 pcs dove choc candy Supper: 4 veg canned gr beans, potatoes, carrots, froz broccoli, fresh squash + meat-- [place] loin, chicken breast, crock pot pork chop; salad from [employer] or [employer] out on Thursdays [employer], fish, BBQ usu avoids potatoes, hush  puppies; dessert for parents --patient limits to once a week; Snack: occasionally yogurt or ice cream bar Beverages: hot tea with stevia, some water not enough per patient; 1 regular coca cola daily  Nutrition Care Education: Topics covered: weight control Basic nutrition: basic food groups, appropriate nutrient balance, appropriate meal and snack schedule, general nutrition guidelines    Weight control: importance of low fat and low sugar food choices, plate method for planning balanced meals and appropriate food portions, healthy options for meals and snacks; role of physical activity; tracking food intake. Advanced nutrition: food label reading for carbs and sugar Gluten-free diet: options for gluten free starches, controlling portions of gluten-containing foods.  Nutritional Diagnosis:  [place]-3.3 Overweight/obesity As related to excess calories, history of inactivity, high stress level.  As evidenced by patient with current BMI of 39.5, and patient report of diet and activity history.  Intervention:   Instruction as noted above.  Patient is taking measures to control calories, fat, and sugar in her diet.  Set goals with direction from patient, including planning time for relaxed meals and reduce stress.    Education Materials given:  . Plate Planner with food lists . Sample meal pattern/ menus . Goals/ instructions   Learner/ who was taught:  . Patient   Level of understanding: Marland Kitchen Verbalizes/ demonstrates competency   Demonstrated degree of understanding via:   Teach back Learning barriers: . None   Willingness to learn/ readiness for change: . Eager, change in progress  Monitoring and Evaluation:  Dietary intake, exercise, GI symptoms, and body weight      follow up: 02/25/19

## 2019-01-14 NOTE — Patient Instructions (Signed)
   Take time to eat 3 meals daily, sit down and relax for at least 15 minutes to enjoy each meal. Consider having breakfast alone early in am.   Choose small portions of starchy foods, that are mostly gluten-free -- especially oats, corn, rice, potatoes.   Use Lose It app to track food intake and activity, goal would be 1200-1400 calories daily.   Concentrate on eating plenty of low-carb veggies and fruits with meals and for snacks.  Try dressing up fruit for dessert, such as frozen berries, cherries, or peaches topped with vanilla yogurt and maybe a few chopped nuts; sliced banana drizzled with a small amount of chocolate or caramel syrup or on top of a vanilla wafer. Try using a graham cracker square, then spread with pudding (use 2/3- 3/4 cup milk for 1 box pudding then mix with 1/2 container cool whip and let it thicken), top with another graham cracker square and freeze. Makes a lower-cal ice cream sandwich.

## 2019-02-05 DIAGNOSIS — M25562 Pain in left knee: Secondary | ICD-10-CM | POA: Diagnosis not present

## 2019-02-05 DIAGNOSIS — M461 Sacroiliitis, not elsewhere classified: Secondary | ICD-10-CM | POA: Diagnosis not present

## 2019-02-05 DIAGNOSIS — G8929 Other chronic pain: Secondary | ICD-10-CM | POA: Diagnosis not present

## 2019-02-05 DIAGNOSIS — M533 Sacrococcygeal disorders, not elsewhere classified: Secondary | ICD-10-CM | POA: Diagnosis not present

## 2019-02-05 DIAGNOSIS — M545 Low back pain: Secondary | ICD-10-CM | POA: Diagnosis not present

## 2019-02-05 DIAGNOSIS — M47816 Spondylosis without myelopathy or radiculopathy, lumbar region: Secondary | ICD-10-CM | POA: Diagnosis not present

## 2019-02-11 ENCOUNTER — Other Ambulatory Visit: Payer: Self-pay | Admitting: Student

## 2019-02-11 DIAGNOSIS — G8929 Other chronic pain: Secondary | ICD-10-CM

## 2019-02-11 DIAGNOSIS — M5441 Lumbago with sciatica, right side: Principal | ICD-10-CM

## 2019-02-18 DIAGNOSIS — M2560 Stiffness of unspecified joint, not elsewhere classified: Secondary | ICD-10-CM | POA: Diagnosis not present

## 2019-02-18 DIAGNOSIS — M6281 Muscle weakness (generalized): Secondary | ICD-10-CM | POA: Diagnosis not present

## 2019-02-18 DIAGNOSIS — M545 Low back pain: Secondary | ICD-10-CM | POA: Diagnosis not present

## 2019-02-18 DIAGNOSIS — M461 Sacroiliitis, not elsewhere classified: Secondary | ICD-10-CM | POA: Diagnosis not present

## 2019-02-21 ENCOUNTER — Ambulatory Visit
Admission: RE | Admit: 2019-02-21 | Discharge: 2019-02-21 | Disposition: A | Discharge status: Home / Self Care | Payer: Medicare HMO | Source: Ambulatory Visit | Attending: Student | Admitting: Student

## 2019-02-21 DIAGNOSIS — G8929 Other chronic pain: Secondary | ICD-10-CM | POA: Insufficient documentation

## 2019-02-21 DIAGNOSIS — M5441 Lumbago with sciatica, right side: Secondary | ICD-10-CM | POA: Diagnosis not present

## 2019-02-21 DIAGNOSIS — M545 Low back pain: Secondary | ICD-10-CM | POA: Diagnosis not present

## 2019-02-25 ENCOUNTER — Encounter: Payer: Self-pay | Admitting: Dietician

## 2019-02-25 ENCOUNTER — Encounter: Payer: Medicare HMO | Attending: Physician Assistant | Admitting: Dietician

## 2019-02-25 VITALS — Ht 60.0 in | Wt 199.3 lb

## 2019-02-25 DIAGNOSIS — E669 Obesity, unspecified: Secondary | ICD-10-CM

## 2019-02-25 DIAGNOSIS — E6609 Other obesity due to excess calories: Secondary | ICD-10-CM | POA: Insufficient documentation

## 2019-02-25 DIAGNOSIS — Z6839 Body mass index (BMI) 39.0-39.9, adult: Secondary | ICD-10-CM | POA: Insufficient documentation

## 2019-02-25 DIAGNOSIS — J019 Acute sinusitis, unspecified: Secondary | ICD-10-CM | POA: Diagnosis not present

## 2019-02-25 DIAGNOSIS — M461 Sacroiliitis, not elsewhere classified: Secondary | ICD-10-CM | POA: Diagnosis not present

## 2019-02-25 NOTE — Progress Notes (Signed)
Medical Nutrition Therapy: Visit start time: 7622  end time: 1400  Assessment:  Diagnosis: obesity Medical history changes: no changes per patient Psychosocial issues/ stress concerns: bipolar schizoaffective disorder, history of depression; caring for elderly parents  Current weight: 199.3lbs  Height: 5'0" Medications, supplement changes: no changes  Progress and evaluation: Weight loss of 3.1lbs since previous visit on 01/14/19; patient states she has taken a round of prednisone in the past month also, which halted weight loss for 1-2 weeks. She reports drinking more water and some green tea; eating less and sometimes less often, occasionally missing breakfast or lunch due to lack of hunger and focus on other activities. Using LoseIt to track intake and staying within 500kcal of goal 1200-1400kcal, working to decrease carbs and increase protein. She is using a smart watch for tracking food intake and activity.   Physical activity: PT 2x a week; caring for parents  Dietary Intake:  Usual eating pattern includes 2-3 meals and 1-2 snacks per day. Dining out frequency: 2-3 meals per week.  Breakfast: oatmeal, banana, cheerios with almond milk, yogurt, McDonald's on Thursdays egg McMuffin no cheese+ 1-2c coffee Snack: protein shake 240kcal 20-25g protein if skips breakfast (or lunch) Lunch: ; 1/4 pounder no cheese;  Avocado or tuna salad with black olives; deli meat + provolone + spinach on sandwich round, quarters and eats 2-4 pieces.  Snack: none or protein shake if no lunch Supper: chicken with onion and peppers with cooking spray; 2/26 chicken thigh, green beans, carrots; Saturdays takeout fish Snack: sometimes low fat and low sugar oilos yogurt Beverages: water, green tea, coffee in am with small amount creamer; occasional Coke zero, stopped regular soda  Nutrition Care Education: Topics covered: weight control Basic nutrition: appropriate nutrient balance, eating at regular intervals     Weight control: reviewed patient's progress since previous visit; discussed roles of carbohydrate and protein in maintaining/ building muscle and energy; reviewed options for quick and balanced meals and snacks  Nutritional Diagnosis:  [place]-3.3 Overweight/obesity As related to history of excess calories and inactivity, stress/ anxiety and medications.  As evidenced by patient with current BMI of 38.92, following dietary guidelines for ongoing weight loss.  Intervention:   Discussion as noted above.  Commended patient for changes made.   Updated goals for ongoing weight loss and adequate nutritional intake.   Patient requested next follow-up in 2 months.  Education Materials given:  Marland Kitchen Goals/ instructions  Learner/ who was taught:  . Patient    Level of understanding: Marland Kitchen Verbalizes/ demonstrates competency   Demonstrated degree of understanding via:   Teach back Learning barriers: . None  Willingness to learn/ readiness for change: . Eager, change in progress   Monitoring and Evaluation:  Dietary intake, exercise, and body weight      follow up: 04/29/19

## 2019-02-25 NOTE — Patient Instructions (Signed)
   Start using pedal machine for some exercise in mornings. Continue to do PT exercises.   Continue making healthy food choices, great job!  Keep aiming for 1200 calories or a little more each day.

## 2019-03-04 DIAGNOSIS — M461 Sacroiliitis, not elsewhere classified: Secondary | ICD-10-CM | POA: Diagnosis not present

## 2019-03-08 DIAGNOSIS — M461 Sacroiliitis, not elsewhere classified: Secondary | ICD-10-CM | POA: Diagnosis not present

## 2019-03-11 DIAGNOSIS — M461 Sacroiliitis, not elsewhere classified: Secondary | ICD-10-CM | POA: Diagnosis not present

## 2019-03-15 DIAGNOSIS — J019 Acute sinusitis, unspecified: Secondary | ICD-10-CM | POA: Diagnosis not present

## 2019-04-15 DIAGNOSIS — W57XXXA Bitten or stung by nonvenomous insect and other nonvenomous arthropods, initial encounter: Secondary | ICD-10-CM | POA: Diagnosis not present

## 2019-04-15 DIAGNOSIS — S40262A Insect bite (nonvenomous) of left shoulder, initial encounter: Secondary | ICD-10-CM | POA: Diagnosis not present

## 2019-04-26 DIAGNOSIS — F209 Schizophrenia, unspecified: Secondary | ICD-10-CM | POA: Diagnosis not present

## 2019-04-29 ENCOUNTER — Encounter: Payer: Medicare HMO | Attending: Physician Assistant | Admitting: Dietician

## 2019-04-29 ENCOUNTER — Other Ambulatory Visit: Payer: Self-pay

## 2019-04-29 ENCOUNTER — Encounter: Payer: Self-pay | Admitting: Dietician

## 2019-04-29 VITALS — Ht 60.0 in | Wt 200.3 lb

## 2019-04-29 DIAGNOSIS — E6609 Other obesity due to excess calories: Secondary | ICD-10-CM | POA: Diagnosis not present

## 2019-04-29 DIAGNOSIS — E669 Obesity, unspecified: Secondary | ICD-10-CM

## 2019-04-29 DIAGNOSIS — Z6839 Body mass index (BMI) 39.0-39.9, adult: Secondary | ICD-10-CM | POA: Diagnosis not present

## 2019-04-29 NOTE — Patient Instructions (Addendum)
   Try making a protein shake at night and keep in the fridge for the next day.  Continue to make healthy food choices, and eat at regular intervals during the day, great job!  Increase intake of water and decrease sodas to once a day or less. OK to add a small amount of honey and some lemon in the water.   Resume some exercise with the pedal machine to burn calories and help with arthritis pain.

## 2019-04-29 NOTE — Progress Notes (Signed)
Medical Nutrition Therapy: Visit start time: 9476  end time: 5465  Assessment:  Diagnosis: obesity Medical history changes: recent tick bite Psychosocial issues/ stress concerns: bipolar schizoaffective disorder, history of depression; caring for elderly parents  Current weight: 200.3lbs  Height: 5'0" Medications, supplement changes: reconciled list in medical record  Progress and evaluation:   Reports tick bite recently which has drained her energy and increased nausea (also due to taking antibiotic-- now finished). She has been drinking regular coke to ease nausea. She is gradually improving, feels she will be back to normal in 1-2 days.   Less physical activity due to fatigue   Patient reports weight of 194lbs several days ago in another office.   Physical activity: none recently due to symptoms from tick bite; usually PT + seated pedalling machine, which helps ease arthiritis pain  Dietary Intake:  Usual eating pattern includes 2-3 meals and 1 snacks per day. Dining out frequency: not assessed today.  Breakfast: coffee with creamer Snack: (breakfast) oatmeal with fruit; or grits. Lunch:  Tuna or pimiento cheese on GF crackers; ham and cheese lunchable no crackers Snack: tangelo Supper: 5-7pm lean meat + 2-3 veg, no bread (cooks for parents) Snack: none Beverages: water with lemon and honey (at least once a day at night to settle upset stomach); hot tea, water, some regular coke recently.   Nutrition Care Education: Topics covered: weight control    Weight control: reviewed progress since previous visit; resuming weight loss habits after setback; options for quick meals    Nutritional Diagnosis:  [place]-3.3 Overweight/obesity As related to history of excess calories and inactivity.  As evidenced by patient with current BMI of 39.12, working on diet and lifestyle changes for ongoing weight loss.  Intervention:   Discussion as noted above.  Patient has concrete plans for  reducing regular soda and resuming other healthy habits after her tickbite reaction.   Updated goals with direction from patient.   Education Materials given:  Marland Kitchen Goals/ instructions   Learner/ who was taught:  . Patient   Level of understanding: Marland Kitchen Verbalizes/ demonstrates competency   Demonstrated degree of understanding via:   Teach back Learning barriers: . None  Willingness to learn/ readiness for change: . Eager, change in progress   Monitoring and Evaluation:  Dietary intake, exercise, and body weight      follow up: 06/03/19

## 2019-05-07 DIAGNOSIS — R300 Dysuria: Secondary | ICD-10-CM | POA: Diagnosis not present

## 2019-05-07 DIAGNOSIS — R350 Frequency of micturition: Secondary | ICD-10-CM | POA: Diagnosis not present

## 2019-06-02 DIAGNOSIS — S40862A Insect bite (nonvenomous) of left upper arm, initial encounter: Secondary | ICD-10-CM | POA: Diagnosis not present

## 2019-06-02 DIAGNOSIS — M2550 Pain in unspecified joint: Secondary | ICD-10-CM | POA: Diagnosis not present

## 2019-06-02 DIAGNOSIS — S40861A Insect bite (nonvenomous) of right upper arm, initial encounter: Secondary | ICD-10-CM | POA: Diagnosis not present

## 2019-06-02 DIAGNOSIS — R21 Rash and other nonspecific skin eruption: Secondary | ICD-10-CM | POA: Diagnosis not present

## 2019-06-02 DIAGNOSIS — R300 Dysuria: Secondary | ICD-10-CM | POA: Diagnosis not present

## 2019-06-02 DIAGNOSIS — W57XXXD Bitten or stung by nonvenomous insect and other nonvenomous arthropods, subsequent encounter: Secondary | ICD-10-CM | POA: Diagnosis not present

## 2019-06-03 ENCOUNTER — Encounter: Payer: Medicare HMO | Attending: Physician Assistant | Admitting: Dietician

## 2019-06-03 ENCOUNTER — Encounter: Payer: Self-pay | Admitting: Dietician

## 2019-06-03 ENCOUNTER — Other Ambulatory Visit: Payer: Self-pay

## 2019-06-03 VITALS — Ht 60.0 in | Wt 199.6 lb

## 2019-06-03 DIAGNOSIS — E6609 Other obesity due to excess calories: Secondary | ICD-10-CM | POA: Insufficient documentation

## 2019-06-03 DIAGNOSIS — Z6839 Body mass index (BMI) 39.0-39.9, adult: Secondary | ICD-10-CM | POA: Insufficient documentation

## 2019-06-03 DIAGNOSIS — E669 Obesity, unspecified: Secondary | ICD-10-CM

## 2019-06-03 NOTE — Patient Instructions (Signed)
   Continue with Daily Harvest meal choices; make sure to have at least 1 meal of solid food daily.   Add 1/2- 1 scoop of protein powder to smoothie shake, at least in the mornings.   Keep up the good daily exercise, great job!

## 2019-06-03 NOTE — Progress Notes (Signed)
Medical Nutrition Therapy: Visit start time: 4098  end time: 1400  Assessment:  Diagnosis: obesity Medical history changes: no changes per patient Psychosocial issues/ stress concerns: bipolar schizoaffective disorder, history of depression; caring for elderly parents  Current weight: 199.6lbs Height: 5'0" Medications, supplement changes: reconciled list in medical record  Progress and evaluation:   Weight loss of 0.8lbs since previous visit.  Has signed up for Daily [employer]vest food delivery program, starting with smoothies and plans to add solid food meals. She feels this will help reduce restaurant meals and provide better nutrition with quick and convenient meals.   Recently decreased appetite in the evening since recent tick bite; currently on antibiotics.  Physical activity: push-pedal machine 30-45 minutes 7x a week + yardwork once every 2 weeks  Dietary Intake:  Usual eating pattern includes 3 meals and 1 snacks per day. Dining out frequency: not assessed today.  Breakfast: smoothie drink (kale, fruit) made with almond milk Snack: none Lunch: salmon or tuna salad no bread; McD's nuggets, sm fries and unsweetened tea; trying to stop fast foods Snack: protein bar Rx or One brands; about 1oz cheddar cheeseSnack: none Supper: protein + starch + veg ie green beans, zucchini, squash, cabbage; plans to begin smoothie shake or Daily Harvest microwave meal ie veggie flatbread pizza Beverages: water with lemon, unsweetened tea or green tea  Nutrition Care Education: Topics covered: weight control Weight control: reviewed progress since previous visit; discussed use of liquid meal replacement drinks and encouraged limiting to 1 per day with occasional 2 per day if needed to avoid skipping meals; discussed protein needs and inclusion of protein with meal replacement drink.  Reviewed importance of physical activity  Nutritional Diagnosis:  [place]-3.3 Overweight/obesity As related to history of  excess calories and inactivity.  As evidenced by patient with current BMI of 38.98, working on dietary and lifestyle imrprovements for ongoing weight loss.  Intervention:   Discussion and instruction as noted above.  Patient continues to make positive lifestyle changes.   Updated goals with input from patient.  Education Materials given:  Marland Kitchen Goals/ instructions   Learner/ who was taught:  . Patient    Level of understanding: Marland Kitchen Verbalizes/ demonstrates competency   Demonstrated degree of understanding via:   Teach back Learning barriers: . None   Willingness to learn/ readiness for change: . Eager, change in progress   Monitoring and Evaluation:  Dietary intake, exercise, and body weight      follow up: 07/08/19

## 2019-06-21 DIAGNOSIS — M533 Sacrococcygeal disorders, not elsewhere classified: Secondary | ICD-10-CM | POA: Diagnosis not present

## 2019-06-21 DIAGNOSIS — M5441 Lumbago with sciatica, right side: Secondary | ICD-10-CM | POA: Diagnosis not present

## 2019-06-21 DIAGNOSIS — M4310 Spondylolisthesis, site unspecified: Secondary | ICD-10-CM | POA: Diagnosis not present

## 2019-06-21 DIAGNOSIS — M47816 Spondylosis without myelopathy or radiculopathy, lumbar region: Secondary | ICD-10-CM | POA: Diagnosis not present

## 2019-06-21 DIAGNOSIS — G8929 Other chronic pain: Secondary | ICD-10-CM | POA: Diagnosis not present

## 2019-06-24 DIAGNOSIS — L814 Other melanin hyperpigmentation: Secondary | ICD-10-CM | POA: Diagnosis not present

## 2019-06-24 DIAGNOSIS — L718 Other rosacea: Secondary | ICD-10-CM | POA: Diagnosis not present

## 2019-06-24 DIAGNOSIS — D2330 Other benign neoplasm of skin of unspecified part of face: Secondary | ICD-10-CM | POA: Diagnosis not present

## 2019-06-24 DIAGNOSIS — Z1283 Encounter for screening for malignant neoplasm of skin: Secondary | ICD-10-CM | POA: Diagnosis not present

## 2019-06-24 DIAGNOSIS — L821 Other seborrheic keratosis: Secondary | ICD-10-CM | POA: Diagnosis not present

## 2019-06-24 DIAGNOSIS — D1800 Hemangioma unspecified site: Secondary | ICD-10-CM | POA: Diagnosis not present

## 2019-06-24 DIAGNOSIS — L304 Erythema intertrigo: Secondary | ICD-10-CM | POA: Diagnosis not present

## 2019-07-08 ENCOUNTER — Other Ambulatory Visit: Payer: Self-pay

## 2019-07-08 ENCOUNTER — Encounter: Payer: Medicare HMO | Attending: Physician Assistant | Admitting: Dietician

## 2019-07-08 VITALS — Ht 60.0 in | Wt 198.8 lb

## 2019-07-08 DIAGNOSIS — Z6838 Body mass index (BMI) 38.0-38.9, adult: Secondary | ICD-10-CM | POA: Diagnosis not present

## 2019-07-08 DIAGNOSIS — E669 Obesity, unspecified: Secondary | ICD-10-CM

## 2019-07-08 DIAGNOSIS — E6609 Other obesity due to excess calories: Secondary | ICD-10-CM | POA: Diagnosis not present

## 2019-07-08 NOTE — Patient Instructions (Signed)
   Keep up the regular exercise, great job!  Continue with your current eating pattern with 1-2 meals of solid foods daily along with 1-2 protein drinks and 1-2 snacks if needed. Great job making healthy food choices!

## 2019-07-08 NOTE — Progress Notes (Signed)
Medical Nutrition Therapy: Visit start time: 3536  end time: 1330  Assessment:  Diagnosis: obesity Medical history changes: no changes Psychosocial issues/ stress concerns: bipolar schizoaffective disorder, history of depression; caring for elderly parents  Current weight: 198.8  Height: 5'0" Medications, supplement changes: no changes per patient  Progress and evaluation:   Weight loss of 0.8lbs since previous visit on 06/03/19. Patient reports clothing fitting more loosely, and feeling increased muscle tone in legs.   She is consuming fruit-veg-nut smoothie drinks and adding some extra protein for 1-2 meals daily, to avoid restaurant meals and allow for quick and simple nutrition.   She reports spinal injection soon after previous visit to treat back pain, with some side effects including headache x2 days which she states typically happens, but has now resumed regular activity.   Physical activity: push-pedal exercise (sitting exercise) 30 minutes daily  Dietary Intake:  Usual eating pattern includes 3 meals and 2 snacks per day. Dining out frequency: 0-1 meals per week.  Breakfast: fruit and protein shake, with water and almond milk Snack: none Lunch: fruit and protein and veg shake; occ ham sandwich on rye with spinach  Snack: 1 glass regular Coke or protein bar, or small square cheese Supper: lean meat + 1-2 veg yellow squash often  Snack: yogurt Beverages: unsweetened iced tea; water; 0-1 glass regular soda daily  Nutrition Care Education: Topics covered: weight control  Weight control: reviewed progress since previous visit; discussed changes in weight vs. Body composition; reviewed benefits of including meals of solid foods and keeping liquid meals to no more than 1-2 daily.    Nutritional Diagnosis:  -3.3 Overweight/obesity As related to history of excess calories and inactivity.  As evidenced by patient with current BMI of 38.8, following dietary guidelines and other  lifestyle habits to promote ongoing weight loss.  Intervention:   Weight loss continues slowly but steadily; body composition likely improving per patient report of changes.   Commended patient for ongoing adherence to diet and exercise.  Goal is to continue with current eating pattern and exercise.   Patient will plan to return for brief weight check encounter in about 6 weeks.   Education Materials given:  Marland Kitchen Goals/ instructions  Learner/ who was taught:  . Patient    Level of understanding: Marland Kitchen Verbalizes/ demonstrates competency   Demonstrated degree of understanding via:   Teach back Learning barriers: . None   Willingness to learn/ readiness for change: . Eager, change in progress   Monitoring and Evaluation:  body weight      follow up: 08/19/19 for weight check only

## 2019-07-26 DIAGNOSIS — F209 Schizophrenia, unspecified: Secondary | ICD-10-CM | POA: Diagnosis not present

## 2019-08-05 DIAGNOSIS — R509 Fever, unspecified: Secondary | ICD-10-CM | POA: Diagnosis not present

## 2019-08-05 DIAGNOSIS — N898 Other specified noninflammatory disorders of vagina: Secondary | ICD-10-CM | POA: Diagnosis not present

## 2019-08-05 DIAGNOSIS — N3941 Urge incontinence: Secondary | ICD-10-CM | POA: Diagnosis not present

## 2019-08-05 DIAGNOSIS — R1084 Generalized abdominal pain: Secondary | ICD-10-CM | POA: Diagnosis not present

## 2019-08-19 ENCOUNTER — Encounter: Payer: Self-pay | Admitting: Dietician

## 2019-08-19 NOTE — Progress Notes (Signed)
Patient came for weight check; weight is 198.0lbs, 0.8lbs less than previous check on 07/08/19. She voices some frustration with slow weight loss despite diligently adhering to low calorie food choices and staying active.  She reports having had UTI with some secondary edema in the past few weeks, but is now better.  She will plan to return on 09/30/19 for another brief weight check.

## 2019-09-02 DIAGNOSIS — J019 Acute sinusitis, unspecified: Secondary | ICD-10-CM | POA: Diagnosis not present

## 2019-09-16 DIAGNOSIS — L02211 Cutaneous abscess of abdominal wall: Secondary | ICD-10-CM | POA: Diagnosis not present

## 2019-09-16 DIAGNOSIS — J019 Acute sinusitis, unspecified: Secondary | ICD-10-CM | POA: Diagnosis not present

## 2019-09-28 DIAGNOSIS — L739 Follicular disorder, unspecified: Secondary | ICD-10-CM | POA: Diagnosis not present

## 2019-09-30 ENCOUNTER — Encounter: Payer: Self-pay | Admitting: Dietician

## 2019-09-30 NOTE — Progress Notes (Signed)
Patient came for weight check, measured weight at 199.5lbs, which is up 1.5lbs over the past 6 weeks. She reports increased fluid retention in ankles. She reports increased water intake, less sodas except occasional coke zero. She has been more sporadic with pedal pusher exercise, and reports more fatigue taking 2 naps in past week which is unusual for her. This is primarily due to recent insect (tick?) bite for which she is being followed medically.  She tries to remain active in general. 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   B = overnight oats 1c with water; or breakfast shake L = recently Ramen noodles (feels better when eating this) Snack + yogurt or Daily Harvest shake S = meat + 1-2 vegetables  Encouraged using 1/2pkg seasoning with Ramen noodles Advised keeping supplemental protein to 2 servings daily.  She also plans to increase physical exercise; discussed possibly adding some strength-building exercises like wall push-ups, squats, lunges.  Scheduled another follow-up visit for 12/02/19.

## 2019-10-01 DIAGNOSIS — T7840XA Allergy, unspecified, initial encounter: Secondary | ICD-10-CM | POA: Diagnosis not present

## 2019-10-01 DIAGNOSIS — R21 Rash and other nonspecific skin eruption: Secondary | ICD-10-CM | POA: Diagnosis not present

## 2019-10-15 DIAGNOSIS — M79671 Pain in right foot: Secondary | ICD-10-CM | POA: Diagnosis not present

## 2019-10-15 DIAGNOSIS — M216X1 Other acquired deformities of right foot: Secondary | ICD-10-CM | POA: Diagnosis not present

## 2019-10-15 DIAGNOSIS — M216X2 Other acquired deformities of left foot: Secondary | ICD-10-CM | POA: Diagnosis not present

## 2019-10-15 DIAGNOSIS — M7731 Calcaneal spur, right foot: Secondary | ICD-10-CM | POA: Diagnosis not present

## 2019-10-15 DIAGNOSIS — M2011 Hallux valgus (acquired), right foot: Secondary | ICD-10-CM | POA: Diagnosis not present

## 2019-10-15 DIAGNOSIS — M722 Plantar fascial fibromatosis: Secondary | ICD-10-CM | POA: Diagnosis not present

## 2019-10-15 DIAGNOSIS — M2142 Flat foot [pes planus] (acquired), left foot: Secondary | ICD-10-CM | POA: Diagnosis not present

## 2019-10-15 DIAGNOSIS — M205X1 Other deformities of toe(s) (acquired), right foot: Secondary | ICD-10-CM | POA: Diagnosis not present

## 2019-10-15 DIAGNOSIS — M2141 Flat foot [pes planus] (acquired), right foot: Secondary | ICD-10-CM | POA: Diagnosis not present

## 2019-10-19 DIAGNOSIS — F209 Schizophrenia, unspecified: Secondary | ICD-10-CM | POA: Diagnosis not present

## 2019-10-19 DIAGNOSIS — F419 Anxiety disorder, unspecified: Secondary | ICD-10-CM | POA: Diagnosis not present

## 2019-10-27 DIAGNOSIS — J329 Chronic sinusitis, unspecified: Secondary | ICD-10-CM | POA: Diagnosis not present

## 2019-10-27 DIAGNOSIS — T7840XA Allergy, unspecified, initial encounter: Secondary | ICD-10-CM | POA: Diagnosis not present

## 2019-11-04 DIAGNOSIS — Z0001 Encounter for general adult medical examination with abnormal findings: Secondary | ICD-10-CM | POA: Diagnosis not present

## 2019-11-05 DIAGNOSIS — M216X2 Other acquired deformities of left foot: Secondary | ICD-10-CM | POA: Diagnosis not present

## 2019-11-05 DIAGNOSIS — M205X1 Other deformities of toe(s) (acquired), right foot: Secondary | ICD-10-CM | POA: Diagnosis not present

## 2019-11-05 DIAGNOSIS — M2141 Flat foot [pes planus] (acquired), right foot: Secondary | ICD-10-CM | POA: Diagnosis not present

## 2019-11-05 DIAGNOSIS — M7731 Calcaneal spur, right foot: Secondary | ICD-10-CM | POA: Diagnosis not present

## 2019-11-05 DIAGNOSIS — M2142 Flat foot [pes planus] (acquired), left foot: Secondary | ICD-10-CM | POA: Diagnosis not present

## 2019-11-05 DIAGNOSIS — M2011 Hallux valgus (acquired), right foot: Secondary | ICD-10-CM | POA: Diagnosis not present

## 2019-11-05 DIAGNOSIS — M79671 Pain in right foot: Secondary | ICD-10-CM | POA: Diagnosis not present

## 2019-11-05 DIAGNOSIS — M216X1 Other acquired deformities of right foot: Secondary | ICD-10-CM | POA: Diagnosis not present

## 2019-11-05 DIAGNOSIS — M722 Plantar fascial fibromatosis: Secondary | ICD-10-CM | POA: Diagnosis not present

## 2019-11-11 DIAGNOSIS — F250 Schizoaffective disorder, bipolar type: Secondary | ICD-10-CM | POA: Diagnosis not present

## 2019-11-11 DIAGNOSIS — Z0000 Encounter for general adult medical examination without abnormal findings: Secondary | ICD-10-CM | POA: Diagnosis not present

## 2019-11-11 DIAGNOSIS — M810 Age-related osteoporosis without current pathological fracture: Secondary | ICD-10-CM | POA: Diagnosis not present

## 2019-11-11 DIAGNOSIS — E282 Polycystic ovarian syndrome: Secondary | ICD-10-CM | POA: Diagnosis not present

## 2019-11-11 DIAGNOSIS — Z1331 Encounter for screening for depression: Secondary | ICD-10-CM | POA: Diagnosis not present

## 2019-11-17 ENCOUNTER — Other Ambulatory Visit: Payer: Self-pay | Admitting: Internal Medicine

## 2019-11-17 DIAGNOSIS — Z1231 Encounter for screening mammogram for malignant neoplasm of breast: Secondary | ICD-10-CM

## 2019-11-29 ENCOUNTER — Encounter: Payer: Self-pay | Admitting: Dietician

## 2019-11-29 ENCOUNTER — Encounter: Payer: Medicare HMO | Attending: Physician Assistant | Admitting: Dietician

## 2019-11-29 ENCOUNTER — Other Ambulatory Visit: Payer: Self-pay

## 2019-11-29 VITALS — Ht 60.0 in | Wt 197.2 lb

## 2019-11-29 DIAGNOSIS — E6609 Other obesity due to excess calories: Secondary | ICD-10-CM | POA: Diagnosis not present

## 2019-11-29 DIAGNOSIS — Z6838 Body mass index (BMI) 38.0-38.9, adult: Secondary | ICD-10-CM | POA: Diagnosis not present

## 2019-11-29 DIAGNOSIS — E669 Obesity, unspecified: Secondary | ICD-10-CM

## 2019-11-29 NOTE — Patient Instructions (Signed)
   Continue with current eating pattern for balanced nutrition and calorie control.   Keep up the regular exercise, great job!

## 2019-11-29 NOTE — Progress Notes (Signed)
Medical Nutrition Therapy: Visit start time: 1450  end time: 1515  Assessment:  Diagnosis: obesity Medical history changes: no changes Psychosocial issues/ stress concerns: increased stress with mother's health declining; holiday season  Current weight: 197.2lbs Height: 5'0" Medications, supplement changes: reconciled list in medical record  Progress and evaluation:  . Patient reports fluctuating weight. Lowest recent weight was 191lbs at home after exercise 5x in one week.  . Using Lose It app to track caloric intake . More frequent pain days recently with fibromyalgia.    Physical activity: pedalling exerciser 30-45 minutes total 90 minutes 3-5x a week   Dietary Intake:  Usual eating pattern includes 3 meals and ? snacks per day. Dining out frequency: ? meals per week.  Breakfast: daily harvest shake (kale, mango, spinach, other fruits) with collagen powder 150-170kcal, + 10kcal creamer in coffee Snack:  Lunch: 1pc rye bread with pimento cheese 360kcal Snack: was eating yogurt or portion of Daily Harvest shake Supper: lean protein with rice/ starch + low-carb veg every other day; alternate days daily harvest shake 250-350kcal Snack: none Beverages: water, unsweetened iced tea, daily harvest shakes  Nutrition Care Education: Topics covered:     Weight control: reviewed progress since previous visit, natural weight fluctuation expectations; importance of maximizing nutritional value of foods when following low-calorie eating pattern   Nutritional Diagnosis:  [place]-3.3 Overweight/obesity As related to history of inactivity, excess calories.  As evidenced by patient with current BMI of 38.5, following low-calorie eating pattern and active lifestyle to promote ongoing weight loss. .  Intervention:  . Instruction and discussion as noted above. . Advised not limiting caloric intake any further than current eating pattern.  . Patient will plan to continue with current eating and  activity pattern for now.   Education Materials given:  Marland Kitchen Goals/ instructions (AVS)   Learner/ who was taught:  . Patient    Level of understanding: Marland Kitchen Verbalizes/ demonstrates competency   Demonstrated degree of understanding via:   Teach back Learning barriers: . None   Willingness to learn/ readiness for change: . Eager, change in progress   Monitoring and Evaluation:  Dietary intake, exercise, and body weight      follow up: 01/13/20 at 1:15pm

## 2019-12-02 ENCOUNTER — Ambulatory Visit: Payer: Medicare HMO | Admitting: Dietician

## 2019-12-03 DIAGNOSIS — M205X1 Other deformities of toe(s) (acquired), right foot: Secondary | ICD-10-CM | POA: Diagnosis not present

## 2019-12-03 DIAGNOSIS — M7731 Calcaneal spur, right foot: Secondary | ICD-10-CM | POA: Diagnosis not present

## 2019-12-03 DIAGNOSIS — M2011 Hallux valgus (acquired), right foot: Secondary | ICD-10-CM | POA: Diagnosis not present

## 2019-12-03 DIAGNOSIS — M2142 Flat foot [pes planus] (acquired), left foot: Secondary | ICD-10-CM | POA: Diagnosis not present

## 2019-12-03 DIAGNOSIS — M2141 Flat foot [pes planus] (acquired), right foot: Secondary | ICD-10-CM | POA: Diagnosis not present

## 2019-12-03 DIAGNOSIS — M79671 Pain in right foot: Secondary | ICD-10-CM | POA: Diagnosis not present

## 2019-12-03 DIAGNOSIS — M216X1 Other acquired deformities of right foot: Secondary | ICD-10-CM | POA: Diagnosis not present

## 2019-12-03 DIAGNOSIS — M722 Plantar fascial fibromatosis: Secondary | ICD-10-CM | POA: Diagnosis not present

## 2019-12-16 DIAGNOSIS — L304 Erythema intertrigo: Secondary | ICD-10-CM | POA: Diagnosis not present

## 2019-12-16 DIAGNOSIS — L719 Rosacea, unspecified: Secondary | ICD-10-CM | POA: Diagnosis not present

## 2019-12-16 DIAGNOSIS — L853 Xerosis cutis: Secondary | ICD-10-CM | POA: Diagnosis not present

## 2019-12-16 DIAGNOSIS — L700 Acne vulgaris: Secondary | ICD-10-CM | POA: Diagnosis not present

## 2019-12-16 DIAGNOSIS — D2339 Other benign neoplasm of skin of other parts of face: Secondary | ICD-10-CM | POA: Diagnosis not present

## 2019-12-30 ENCOUNTER — Ambulatory Visit
Admission: RE | Admit: 2019-12-30 | Discharge: 2019-12-30 | Disposition: A | Discharge status: Home / Self Care | Payer: Medicare HMO | Source: Ambulatory Visit | Attending: Internal Medicine | Admitting: Internal Medicine

## 2019-12-30 DIAGNOSIS — Z1231 Encounter for screening mammogram for malignant neoplasm of breast: Secondary | ICD-10-CM

## 2020-01-03 ENCOUNTER — Ambulatory Visit: Payer: Medicare HMO | Admitting: Dietician

## 2020-01-05 DIAGNOSIS — M205X1 Other deformities of toe(s) (acquired), right foot: Secondary | ICD-10-CM | POA: Diagnosis not present

## 2020-01-05 DIAGNOSIS — M2142 Flat foot [pes planus] (acquired), left foot: Secondary | ICD-10-CM | POA: Diagnosis not present

## 2020-01-05 DIAGNOSIS — M216X1 Other acquired deformities of right foot: Secondary | ICD-10-CM | POA: Diagnosis not present

## 2020-01-05 DIAGNOSIS — L84 Corns and callosities: Secondary | ICD-10-CM | POA: Diagnosis not present

## 2020-01-05 DIAGNOSIS — M2141 Flat foot [pes planus] (acquired), right foot: Secondary | ICD-10-CM | POA: Diagnosis not present

## 2020-01-05 DIAGNOSIS — S90851A Superficial foreign body, right foot, initial encounter: Secondary | ICD-10-CM | POA: Diagnosis not present

## 2020-01-05 DIAGNOSIS — M7731 Calcaneal spur, right foot: Secondary | ICD-10-CM | POA: Diagnosis not present

## 2020-01-05 DIAGNOSIS — M2011 Hallux valgus (acquired), right foot: Secondary | ICD-10-CM | POA: Diagnosis not present

## 2020-01-05 DIAGNOSIS — M79671 Pain in right foot: Secondary | ICD-10-CM | POA: Diagnosis not present

## 2020-01-13 ENCOUNTER — Encounter: Payer: Self-pay | Admitting: Dietician

## 2020-01-13 ENCOUNTER — Encounter: Payer: Medicare HMO | Attending: Physician Assistant | Admitting: Dietician

## 2020-01-13 ENCOUNTER — Other Ambulatory Visit: Payer: Self-pay

## 2020-01-13 VITALS — Ht 60.0 in | Wt 198.3 lb

## 2020-01-13 DIAGNOSIS — F209 Schizophrenia, unspecified: Secondary | ICD-10-CM | POA: Diagnosis not present

## 2020-01-13 DIAGNOSIS — E6609 Other obesity due to excess calories: Secondary | ICD-10-CM | POA: Diagnosis not present

## 2020-01-13 DIAGNOSIS — Z713 Dietary counseling and surveillance: Secondary | ICD-10-CM | POA: Diagnosis not present

## 2020-01-13 DIAGNOSIS — E669 Obesity, unspecified: Secondary | ICD-10-CM

## 2020-01-13 DIAGNOSIS — F419 Anxiety disorder, unspecified: Secondary | ICD-10-CM | POA: Diagnosis not present

## 2020-01-13 DIAGNOSIS — Z6838 Body mass index (BMI) 38.0-38.9, adult: Secondary | ICD-10-CM | POA: Diagnosis not present

## 2020-01-13 NOTE — Progress Notes (Signed)
Medical Nutrition Therapy: Visit start time: 1330  end time: 1400  Assessment:  Diagnosis: obesity Medical history changes: no changes per patient Psychosocial issues/ stress concerns: none  Current weight: 198.3lbs Height: 5'0" Medications, supplement changes:   Progress and evaluation:  . Patient reports increase in physical activity using pedalling device in recent weeks. . She reports having occasional fibromyalgia "flare" and feels drianed and achy and unable to do much for 1-2 days.  Marland Kitchen Spent several days around Christmas without exercise, due to busy schedule.  . Continues to use LoseIt app to track food intake and activity. . Has lost about 1 size in pants/ waist. Checks weight once every 1-2 weeks at home.   Physical activity: 60-120 minutes 5-6 x a week  Dietary Intake:  Usual eating pattern includes 3 (1-2 liquid) meals and 0-2 snacks per day. Dining out frequency: 1 meal per week.  Breakfast: daily harvest shake (includes fruit, leafy greens, sometimes oats and/or seeds, protein powder, almond or soy milk) Snack: none Lunch: daily harvest shake same as am; takeout on Thursdays Snack: sometimes yogurt; 1/2 slice rye bread with pimento cheese for snack or lunch Supper: lean protein + veg; occ shake if not hungry Snack: sometimes yogurt of portion of shake; hot tea Beverages: water (2 lg bottles daily), unsweetened iced tea, hot tea, likes seltzer water with lemon  Nutrition Care Education: Topics covered:    Weight control: reviewed progress since previous visit; tracking food intake; discussed content of meal replacement shakes; effect of regular exercise on body composition   Nutritional Diagnosis:  [place]-3.3 Overweight/obesity As related to history of excess calories and inadequate physical activity.  As evidenced by patient with current BMI of 38.7, following diet pattern to promote weight loss..  Intervention:   Discussion as noted above.  Patient feels good about  her current eating pattern and exercise, and will plan to continue without significant change for now.   Her meal replacement shakes are made of whole foods and supplemental protein.   Patient would like to continue with follow-up visits every 2-3 months for ongoing support and accountability.  Education Materials given:  Marland Kitchen Goals/ instructions   Learner/ who was taught:  . Patient   Level of understanding: Marland Kitchen Verbalizes/ demonstrates competency   Demonstrated degree of understanding via:   Teach back Learning barriers: . None  Willingness to learn/ readiness for change: . Eager, change in progress   Monitoring and Evaluation:  Dietary intake, exercise, and body weight      follow up: 04/06/20 at 1:15pm

## 2020-01-13 NOTE — Patient Instructions (Signed)
   Continue with meal replacement shakes and solid meals, great job controlling and tracking intake!  Keep up the great job of including regular exercise.

## 2020-01-18 ENCOUNTER — Ambulatory Visit (INDEPENDENT_AMBULATORY_CARE_PROVIDER_SITE_OTHER): Payer: Medicare HMO | Admitting: Obstetrics & Gynecology

## 2020-01-18 ENCOUNTER — Encounter: Payer: Self-pay | Admitting: Obstetrics & Gynecology

## 2020-01-18 ENCOUNTER — Other Ambulatory Visit: Payer: Self-pay

## 2020-01-18 VITALS — BP 120/80 | Ht 60.0 in | Wt 198.0 lb

## 2020-01-18 DIAGNOSIS — Z01419 Encounter for gynecological examination (general) (routine) without abnormal findings: Secondary | ICD-10-CM

## 2020-01-18 DIAGNOSIS — Z1211 Encounter for screening for malignant neoplasm of colon: Secondary | ICD-10-CM

## 2020-01-18 DIAGNOSIS — N952 Postmenopausal atrophic vaginitis: Secondary | ICD-10-CM | POA: Insufficient documentation

## 2020-01-18 MED ORDER — ESTROGENS, CONJUGATED 0.625 MG/GM VA CREA: 1.0000 | TOPICAL_CREAM | VAGINAL | 3 refills | Status: DC

## 2020-01-18 NOTE — Patient Instructions (Signed)
Conjugated Estrogens vaginal cream What is this medicine? CONJUGATED ESTROGENS (CON ju gate ed ESS troe jenz) are a mixture of female hormones. This cream can help relieve symptoms associated with menopause.like vaginal dryness and irritation. This medicine may be used for other purposes; ask your health care provider or pharmacist if you have questions. COMMON BRAND NAME(S): Premarin What should I tell my health care provider before I take this medicine? They need to know if you have any of these conditions:  abnormal vaginal bleeding  blood vessel disease or blood clots  breast, cervical, endometrial, or uterine cancer  dementia  diabetes  gallbladder disease  heart disease or recent heart attack  high blood pressure  high cholesterol  high level of calcium in the blood  hysterectomy  kidney disease  liver disease  migraine headaches  protein C deficiency  protein S deficiency  stroke  systemic lupus erythematosus (SLE)  tobacco smoker  an unusual or allergic reaction to estrogens other medicines, foods, dyes, or preservatives  pregnant or trying to get pregnant  breast-feeding How should I use this medicine? This medicine is for use in the vagina only. Do not take by mouth. Follow the directions on the prescription label. Use at bedtime unless otherwise directed by your doctor or health care professional. Use the special applicator supplied with the cream. Wash hands before and after use. Fill the applicator with the cream and remove from the tube. Lie on your back, part and bend your knees. Insert the applicator into the vagina and push the plunger to expel the cream into the vagina. Wash the applicator with warm soapy water and rinse well. Use exactly as directed for the complete length of time prescribed. Do not stop using except on the advice of your doctor or health care professional. Talk to your pediatrician regarding the use of this medicine in  children. Special care may be needed. A patient package insert for the product will be given with each prescription and refill. Read this sheet carefully each time. The sheet may change frequently. Overdosage: If you think you have taken too much of this medicine contact a poison control center or emergency room at once. NOTE: This medicine is only for you. Do not share this medicine with others. What if I miss a dose? If you miss a dose, use it as soon as you can. If it is almost time for your next dose, use only that dose. Do not use double or extra doses. What may interact with this medicine? Do not take this medicine with any of the following medications:  aromatase inhibitors like aminoglutethimide, anastrozole, exemestane, letrozole, testolactone This medicine may also interact with the following medications:  barbiturates used for inducing sleep or treating seizures  carbamazepine  grapefruit juice  medicines for fungal infections like itraconazole and ketoconazole  raloxifene or tamoxifen  rifabutin  rifampin  rifapentine  ritonavir  some antibiotics used to treat infections  St. John's Wort  warfarin This list may not describe all possible interactions. Give your health care provider a list of all the medicines, herbs, non-prescription drugs, or dietary supplements you use. Also tell them if you smoke, drink alcohol, or use illegal drugs. Some items may interact with your medicine. What should I watch for while using this medicine? Visit your health care professional for regular checks on your progress. You will need a regular breast and pelvic exam. You should also discuss the need for regular mammograms with your health care professional, and  follow his or her guidelines. °This medicine can make your body retain fluid, making your fingers, hands, or ankles swell. Your blood pressure can go up. Contact your doctor or health care professional if you feel you are  retaining fluid. °If you have any reason to think you are pregnant; stop taking this medicine at once and contact your doctor or health care professional. °Tobacco smoking increases the risk of getting a blood clot or having a stroke, especially if you are more than 64 years old. You are strongly advised not to smoke. °If you wear contact lenses and notice visual changes, or if the lenses begin to feel uncomfortable, consult your eye care specialist. °If you are going to have elective surgery, you may need to stop taking this medicine beforehand. Consult your health care professional for advice prior to scheduling the surgery. °What side effects may I notice from receiving this medicine? °Side effects that you should report to your doctor or health care professional as soon as possible: °· allergic reactions like skin rash, itching or hives, swelling of the face, lips, or tongue °· breast tissue changes or discharge °· changes in vision °· chest pain °· confusion, trouble speaking or understanding °· dark urine °· general ill feeling or flu-like symptoms °· light-colored stools °· nausea, vomiting °· pain, swelling, warmth in the leg °· right upper belly pain °· severe headaches °· shortness of breath °· sudden numbness or weakness of the face, arm or leg °· trouble walking, dizziness, loss of balance or coordination °· unusual vaginal bleeding °· yellowing of the eyes or skin °Side effects that usually do not require medical attention (report to your doctor or health care professional if they continue or are bothersome): °· hair loss °· increased hunger or thirst °· increased urination °· symptoms of vaginal infection like itching, irritation or unusual discharge °· unusually weak or tired °This list may not describe all possible side effects. Call your doctor for medical advice about side effects. You may report side effects to FDA at 1-800-FDA-1088. °Where should I keep my medicine? °Keep out of the reach of  children. °Store at room temperature between 15 and 30 degrees C (59 and 86 degrees F). Throw away any unused medicine after the expiration date. °NOTE: This sheet is a summary. It may not cover all possible information. If you have questions about this medicine, talk to your doctor, pharmacist, or health care provider. °© 2020 [employer]vier/Gold Standard (2011-03-20 09:20:36) ° °

## 2020-01-18 NOTE — Progress Notes (Signed)
HPI:      [name] is a 64 y.o. [id number] who LMP was in the past, she presents today for her annual examination.  The patient has no complaints today. The patient is not sexually active. Herlast pap: approximate date 2019 and was normal and last mammogram: approximate date 11/2019 and was normal.  The patient does perform self breast exams.  There is no notable family history of breast or ovarian cancer in her family. The patient is not taking hormone replacement therapy. Patient denies post-menopausal vaginal bleeding.   The patient has regular exercise: yes. The patient denies current symptoms of depression.    GYN Hx: Last Colonoscopy:10 years ago. Normal.  Last DEXA: never ago.    PMHx: Past Medical History:  Diagnosis Date  . Arthritis    knee  . Depression   . GERD (gastroesophageal reflux disease)   . Headache    thinks may have been from tick bite.  None since antibiotic  . Polycystic ovarian syndrome   . Schizophrenia ([place])   . Tick bite    approx 2 wks ago.  has finished round of doxycycline   Past Surgical History:  Procedure Laterality Date  . CESAREAN SECTION    . 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  Marland Kitchen DIAGNOSTIC LAPAROSCOPY  1987  . DILATION AND CURETTAGE OF UTERUS  approx 2004   [facility]  . ESOPHAGOGASTRODUODENOSCOPY (EGD) WITH PROPOFOL N/A 05/15/2016   Procedure: ESOPHAGOGASTRODUODENOSCOPY (EGD) WITH PROPOFOL;  Surgeon: Manya Silvas, MD;  Location: [facility];  Service: Endoscopy;  Laterality: N/A;  . KNEE ARTHROSCOPY Right 07/17/2016   Procedure: ARTHROSCOPY KNEE WITH DEBRIDEMENT AND PARTIAL MEDIAL MENISCECTOMY right knee;  Surgeon: Corky Mull, MD;  Location: [place];  Service: Orthopedics;  Laterality:  Right;  . KNEE ARTHROSCOPY Left 07/09/2017   Procedure: ARTHROSCOPY KNEE  with debridement and medial meniscectomy;  Surgeon: Corky Mull, MD;  Location: [place];  Service: Orthopedics;  Laterality: Left;  . NOSE SURGERY    . OVARIAN CYST REMOVAL  1986   Family History  Adopted: Yes  Problem Relation Age of Onset  . Pancreatic cancer Mother   . Breast cancer Neg Hx    Social History   Tobacco Use  . Smoking status: Never Smoker  . Smokeless tobacco: Never Used  Substance Use Topics  . Alcohol use: No  . Drug use: No    Current Outpatient Medications:  .  acetaminophen (TYLENOL) 500 MG tablet, Take by mouth., Disp: , Rfl:  .  albuterol (VENTOLIN HFA) 108 (90 Base) MCG/ACT inhaler, INHALE 2 PUFFS BY MOUTH FOUR TIMES DAILY, Disp: , Rfl:  .  alendronate (FOSAMAX) 70 MG tablet, Take 70 mg by mouth once a week. Take with a full glass of water on an empty stomach., Disp: , Rfl:  .  b complex vitamins tablet, Take 1 tablet by mouth daily., Disp: , Rfl:  .  Biotin 1 MG CAPS, Take by mouth., Disp: , Rfl:  .  Calcium Carbonate-Vit D-Min (CALCIUM 1200 PO), Take 2,400 mg by mouth daily., Disp: , Rfl:  .  cyanocobalamin 1000 MCG tablet, Take 1,000 mcg by mouth daily., Disp: , Rfl:  .  escitalopram (LEXAPRO) 20 MG tablet, Take by mouth., Disp: , Rfl:  .  fluticasone (FLONASE) 50 MCG/ACT nasal spray, Place into both nostrils daily., Disp: , Rfl:  .  loratadine (CLARITIN) 10 MG tablet, Take 10 mg by mouth daily., Disp: , Rfl:  .  meloxicam (MOBIC) 15 MG tablet, , Disp: , Rfl: 0 .  metroNIDAZOLE (METROCREAM) 0.75 % cream, APP THIN LAYER TO FACE BID, Disp: , Rfl:  .  Multiple Vitamin (MULTIVITAMIN) capsule, Take 1 capsule by mouth daily., Disp: , Rfl:  .  Omega 3 340 MG CPDR, Take by mouth daily., Disp: , Rfl:  .  perphenazine (TRILAFON) 2 MG tablet, Take by mouth., Disp: , Rfl:  .  tretinoin (RETIN-A) 0.1 % cream, , Disp: , Rfl:  .  Zinc Sulfate 66 MG TABS, once daily, Disp: , Rfl:    .  azelastine (ASTELIN) 0.1 % nasal spray, Place into the nose., Disp: , Rfl:  .  clobetasol ointment (TEMOVATE) 0.05 %, Apply to affected area every night for 6 weeks, then every other day for 6 weeks. (Patient not taking: Reported on 01/18/2020), Disp: 30 g, Rfl: 5 .  [START ON 01/20/2020] conjugated estrogens (PREMARIN) vaginal cream, Place 1 Applicatorful vaginally 2 (two) times a week. 1 gram vaginally at bedtime twice weekly, Disp: 30 g, Rfl: 3 .  doxycycline (VIBRA-TABS) 100 MG tablet, , Disp: , Rfl:  Allergies: Oxcarbazepine, Cefuroxime, Citric acid, Eggs or egg-derived products, Flax [bio-flax], Ginger, Lac bovis, Sulfa antibiotics, Wheat bran, Latex, and Other  Review of Systems  Constitutional: Negative for chills, fever and malaise/fatigue.  HENT: Negative for congestion, sinus pain and sore throat.   Eyes: Negative for blurred vision and pain.  Respiratory: Negative for cough and wheezing.   Cardiovascular: Negative for chest pain and leg swelling.  Gastrointestinal: Negative for abdominal pain, constipation, diarrhea, heartburn, nausea and vomiting.  Genitourinary: Negative for dysuria, frequency, hematuria and urgency.  Musculoskeletal: Negative for back pain, joint pain, myalgias and neck pain.  Skin: Negative for itching and rash.  Neurological: Negative for dizziness, tremors and weakness.  Endo/Heme/Allergies: Does not bruise/bleed easily.  Psychiatric/Behavioral: Negative for depression. The patient is not nervous/anxious and does not have insomnia.     Objective: BP 120/80   Ht 5' (1.524 m)   Wt 198 lb (89.8 kg)   BMI 38.67 kg/m   Filed Weights   01/18/20 0856  Weight: 198 lb (89.8 kg)   Body mass index is 38.67 kg/m. Physical Exam Constitutional:      General: She is not in acute distress.    Appearance: She is well-developed.  Genitourinary:     Pelvic exam was performed with patient supine.     Vagina, uterus and rectum normal.     No lesions in the  vagina.     No vaginal bleeding.     No cervical motion tenderness, friability, lesion or polyp.     Uterus is mobile.     Uterus is not enlarged.     No uterine mass detected.    Uterus is midaxial.     No right or left adnexal mass present.     Right adnexa not tender.     Left adnexa not tender.     Genitourinary Comments: Atrophy present  HENT:     Head: Normocephalic and atraumatic. No laceration.     Right Ear: Hearing normal.     Left Ear: Hearing normal.     Mouth/Throat:     Pharynx: Uvula midline.  Eyes:     Pupils: Pupils are equal, round, and  reactive to light.  Neck:     Thyroid: No thyromegaly.  Cardiovascular:     Rate and Rhythm: Normal rate and regular rhythm.     Heart sounds: No murmur. No friction rub. No gallop.   Pulmonary:     Effort: Pulmonary effort is normal. No respiratory distress.     Breath sounds: Normal breath sounds. No wheezing.  Chest:     Breasts:        Right: No mass, skin change or tenderness.        Left: No mass, skin change or tenderness.  Abdominal:     General: Bowel sounds are normal. There is no distension.     Palpations: Abdomen is soft.     Tenderness: There is no abdominal tenderness. There is no rebound.  Musculoskeletal:        General: Normal range of motion.     Cervical back: Normal range of motion and neck supple.  Neurological:     Mental Status: She is alert and oriented to person, place, and time.     Cranial Nerves: No cranial nerve deficit.  Skin:    General: Skin is warm and dry.  Psychiatric:        Judgment: Judgment normal.  Vitals reviewed.     Assessment: Annual Exam 1. Women's annual routine gynecological examination   2. Screen for colon cancer   3. Vaginal atrophy     Plan:            1.  Cervical Screening-  Pap smear schedule reviewed with patient  2. Breast screening- Exam annually and mammogram scheduled  3. Colonoscopy every 10 years, Hemoccult testing after age 49  4. Labs  managed by PCP  5. Counseling for hormonal therapy: wants to start vaginal ERT due to vaginal itch and discomfort eRx and samples Premarin Vag Cream discussed and done today F/u 3 mos to assess              6. FRAX - FRAX score for assessing the 10 year probability for fracture calculated and discussed today.  Based on age and score today, DEXA is not currently scheduled.    F/U  Return in about 3 months (around 04/17/2020) for Follow up tele.  Barnett Applebaum, MD, [facility], [facility] 01/18/2020  10:00 AM

## 2020-02-15 DIAGNOSIS — Z792 Long term (current) use of antibiotics: Secondary | ICD-10-CM | POA: Diagnosis not present

## 2020-02-15 DIAGNOSIS — Z7952 Long term (current) use of systemic steroids: Secondary | ICD-10-CM | POA: Diagnosis not present

## 2020-02-15 DIAGNOSIS — J329 Chronic sinusitis, unspecified: Secondary | ICD-10-CM | POA: Diagnosis not present

## 2020-04-04 DIAGNOSIS — F209 Schizophrenia, unspecified: Secondary | ICD-10-CM | POA: Diagnosis not present

## 2020-04-07 ENCOUNTER — Encounter: Payer: Self-pay | Admitting: Dietician

## 2020-04-07 NOTE — Progress Notes (Signed)
Patient cancelled her appointment for 04/06/20, and did not wish to reschedule at this time. Sent notification to referring provider.

## 2020-04-13 ENCOUNTER — Ambulatory Visit (INDEPENDENT_AMBULATORY_CARE_PROVIDER_SITE_OTHER): Payer: Medicare HMO | Admitting: Dermatology

## 2020-04-13 ENCOUNTER — Other Ambulatory Visit: Payer: Self-pay

## 2020-04-13 DIAGNOSIS — L988 Other specified disorders of the skin and subcutaneous tissue: Secondary | ICD-10-CM | POA: Diagnosis not present

## 2020-04-13 DIAGNOSIS — L858 Other specified epidermal thickening: Secondary | ICD-10-CM

## 2020-04-13 DIAGNOSIS — L719 Rosacea, unspecified: Secondary | ICD-10-CM | POA: Diagnosis not present

## 2020-04-13 DIAGNOSIS — L853 Xerosis cutis: Secondary | ICD-10-CM | POA: Diagnosis not present

## 2020-04-13 DIAGNOSIS — L709 Acne, unspecified: Secondary | ICD-10-CM | POA: Diagnosis not present

## 2020-04-13 DIAGNOSIS — L821 Other seborrheic keratosis: Secondary | ICD-10-CM | POA: Diagnosis not present

## 2020-04-13 DIAGNOSIS — Z1283 Encounter for screening for malignant neoplasm of skin: Secondary | ICD-10-CM | POA: Diagnosis not present

## 2020-04-13 DIAGNOSIS — L304 Erythema intertrigo: Secondary | ICD-10-CM

## 2020-04-13 NOTE — Progress Notes (Signed)
Follow-Up Visit   Subjective  Samantha Dean is a 64 y.o. female who presents for the following: Annual Exam (TBSE), Acne, Eczema.  For acne, she has been using CeraVe cleanser and the tretinoin cream 0.025% on face, getting better.   For Rosacea she is using Metro gel 0.1% gel QD  And is doing much better. Her last flare was a week ago.   For her eczema, she has been alternating Dial body wash and Dove.   For her Intertrigo she has been using Dial body wash.  Patient complains of itchy spot on R. Breast that has been there for past 3 to 4 days.  She also has a rash on B/L arms that has also been there for 3 to 4 days. Does not itch but is red.  The following portions of the chart were reviewed this encounter and updated as appropriate: Tobacco  Allergies  Meds  Problems  Med Hx  Surg Hx  Fam Hx      Review of Systems: No other skin or systemic complaints.  Objective  Well appearing patient in no apparent distress; mood and affect are within normal limits.  A full examination was performed including scalp, head, eyes, ears, nose, lips, neck, chest, axillae, abdomen, back, buttocks, bilateral upper extremities, bilateral lower extremities, hands, feet, fingers, toes, fingernails, and toenails. All findings within normal limits unless otherwise noted below.  Objective  Head - Anterior (Face): Erythematous papules and pustules with comedones   Objective  Right Inframammary Fold, lower abdomen: Mild erythema inframammary and lower abdomen  Objective  Right Upper Arm - Anterior: Tiny follicular keratotic papules.   Objective  B/L Cheeks: Firm subcutaneous papules b/l cheek  Objective  b/l cheeks and nose: Mid face erythema with telangiectasias +/- scattered inflammatory papules.   Objective  Right Inframammary Fold: Stuck-on, waxy, tan-brown papules and plaques -- Discussed benign etiology and prognosis.   Assessment & Plan  Acne, unspecified acne type Head  - Anterior (Face)  Improving Cont tretinoin 0.25% cream pea size amount to entire face QHS as tolerated  Topical retinoid medications like tretinoin/Retin-A, adapalene/Differin, tazarotene/Fabior, and Epiduo/Epiduo Forte can cause dryness and irritation when first started. Only apply a pea-sized amount to the entire affected area. Avoid applying it around the eyes, edges of mouth and creases at the nose. If you experience irritation, use a good moisturizer first and/or apply the medicine less often. If you are doing well with the medicine, you can increase how often you use it until you are applying every night. Be careful with sun protection while using this medication as it can make you sensitive to the sun. This medicine should not be used by pregnant women.    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    Osteoma cutis B/L Cheeks  Improving will continue Tretinoin 0.025% cream pea size amount to entire face QHS as tolerated  and gently skin cleanser  Topical retinoid medications like tretinoin/Retin-A, adapalene/Differin, tazarotene/Fabior, and Epiduo/Epiduo Forte can cause dryness and irritation when first started. Only apply a pea-sized amount to the entire affected area. Avoid applying it around the eyes, edges of mouth and creases at the nose. If you experience irritation, use a good moisturizer first and/or apply the medicine less often. If you are doing well  with the medicine, you can increase how often you use it until you are applying every night. Be careful with sun protection while using this medication as it can make you sensitive to the sun. This medicine should not be used by pregnant women.    Rosacea b/l cheeks and nose  Doing well. Cont. Metronidazole 0.1%  QD  Seborrheic keratosis Right Inframammary Fold  Benign appearing, observe.   Call for changes  Xerosis cutis Body  D/C Dial soap, Recommend Dove soap QD for sensitive skin, Cerave or Vanicream moisturizers  . Return in about 3 months (around 07/13/2020) for TBSE and recheck rash.   IDonzetta Kohut, CMA, am acting as scribe for Forest Gleason, MD .  Documentation: I have reviewed the above documentation for accuracy and completeness, and I agree with the above.  Forest Gleason, MD

## 2020-04-13 NOTE — Patient Instructions (Addendum)
Recommend daily broad spectrum sunscreen SPF 30+ to sun-exposed areas, reapply every 2 hours as needed. Call for new or changing lesions.  Gentle Skin Care Guide  1. Bathe no more than once a day.  2. Avoid bathing in hot water  3. Use a mild soap like Dove, Vanicream, Cetaphil, CeraVe. Can use Lever 2000 or Cetaphil antibacterial soap  4. Use soap only where you need it. On most days, use it under your arms, between your legs, and on your fee. Let the water rinse other areas unless visibly dirty.  5. When you get out of the bath/shower, use a towel to gently blot your skin dry, don't rub it.  6. While your skin is still a little damp, apply a moisturizing cream such as Vanicream,  CeraVe, Cetaphil, Eucerin, Sarna lotion or plain Vaseline Jelly. For hands apply Neutrogena [place] Hand Cream or Excipial Hand Cream.  7. Reapply moisturizer any time you start to itch or feel dry.  8. Sometimes using free and clear laundry detergents can be helpful. Fabric softener  sheets should be avoided. Downy Free & Gentle liquid, or any liquid fabric softener that is free of dyes and perfumes, it acceptable to use  9. If your doctor has given you prescription creams you may apply moisturizers over them   Topical retinoid medications like tretinoin/Retin-A, adapalene/Differin, tazarotene/Fabior, and Epiduo/Epiduo Forte can cause dryness and irritation when first started. Only apply a pea-sized amount to the entire affected area. Avoid applying it around the eyes, edges of mouth and creases at the nose. If you experience irritation, use a good moisturizer first and/or apply the medicine less often. If you are doing well with the medicine, you can increase how often you use it until you are applying every night. Be careful with sun protection while using this medication as it can make you sensitive to the sun. This medicine should not be used by pregnant women.

## 2020-04-17 ENCOUNTER — Encounter: Payer: Self-pay | Admitting: Obstetrics & Gynecology

## 2020-04-17 ENCOUNTER — Other Ambulatory Visit: Payer: Self-pay

## 2020-04-17 ENCOUNTER — Ambulatory Visit (INDEPENDENT_AMBULATORY_CARE_PROVIDER_SITE_OTHER): Payer: Medicare HMO | Admitting: Obstetrics & Gynecology

## 2020-04-17 VITALS — Ht 60.0 in | Wt 200.0 lb

## 2020-04-17 DIAGNOSIS — N952 Postmenopausal atrophic vaginitis: Secondary | ICD-10-CM

## 2020-04-17 MED ORDER — PROGESTERONE MICRONIZED 100 MG PO CAPS: 100.0000 mg | ORAL_CAPSULE | Freq: Every day | ORAL | 3 refills | Status: DC

## 2020-04-17 NOTE — Patient Instructions (Signed)
9

## 2020-04-17 NOTE — Progress Notes (Signed)
Virtual Visit via Telephone Note  I connected with Samantha Dean on 04/17/20 at  2:10 PM EDT by telephone and verified that I am speaking with the correct person using two identifiers.   I discussed the limitations, risks, security and privacy concerns of performing an evaluation and management service by telephone and the availability of in person appointments. I also discussed with the patient that there may be a patient responsible charge related to this service. The patient expressed understanding and agreed to proceed. She was at home and I was in my office.  History of Present Illness:  Samantha Dean is a 64 y.o. who was started on Premarin Vaginal Cream 2 x weekly for vaginal atrophy w sx's of burning and dryness  approximately 3 months ago. Since that time, she states that her symptoms are improving.  Also feels hot flashes and mood are significantly better since starting the vag ERT.  PMHx: She  has a past medical history of Arthritis, Depression, GERD (gastroesophageal reflux disease), Headache, Polycystic ovarian syndrome, Schizophrenia ([place]), and Tick bite. Also,  has a past surgical history that includes Cesarean section; Esophagogastroduodenoscopy (egd) with propofol (N/A, 05/15/2016); Dilation and curettage of uterus (approx 2004); Ovarian cyst removal (1986); Diagnostic laparoscopy [id number]); Knee arthroscopy (Right, 07/17/2016); Knee arthroscopy (Left, 07/09/2017); Chondroplasty (07/09/2017); and Nose surgery., family history includes Pancreatic cancer in her mother. She was adopted.,  reports that she has never smoked. She has never used smokeless tobacco. She reports that she does not drink alcohol or use drugs. Current Meds  Medication Sig  . acetaminophen (TYLENOL) 500 MG tablet Take by mouth.  Marland Kitchen albuterol (VENTOLIN HFA) 108 (90 Base) MCG/ACT inhaler INHALE 2 PUFFS BY MOUTH FOUR TIMES DAILY  . alendronate (FOSAMAX) 70 MG tablet Take 70 mg by mouth once a week. Take with a full glass  of water on an empty stomach.  Marland Kitchen b complex vitamins tablet Take 1 tablet by mouth daily.  . Biotin 1 MG CAPS Take by mouth.  . Calcium Carbonate-Vit D-Min (CALCIUM 1200 PO) Take 2,400 mg by mouth daily.  . clobetasol ointment (TEMOVATE) 0.05 % Apply to affected area every night for 6 weeks, then every other day for 6 weeks.  . conjugated estrogens (PREMARIN) vaginal cream Place 1 Applicatorful vaginally 2 (two) times a week. 1 gram vaginally at bedtime twice weekly  . cyanocobalamin 1000 MCG tablet Take 1,000 mcg by mouth daily.  Marland Kitchen doxycycline (VIBRA-TABS) 100 MG tablet   . escitalopram (LEXAPRO) 20 MG tablet Take by mouth.  . fluticasone (FLONASE) 50 MCG/ACT nasal spray Place into both nostrils daily.  . hydrocortisone 2.5 % cream Apply  a small amount to affected area twice a day as needed PRN rash/itch up to 1 week  . ketoconazole (NIZORAL) 2 % cream   . loratadine (CLARITIN) 10 MG tablet Take 10 mg by mouth daily.  . meloxicam (MOBIC) 15 MG tablet   . metroNIDAZOLE (METROCREAM) 0.75 % cream APP THIN LAYER TO FACE BID  . Multiple Vitamin (MULTIVITAMIN) capsule Take 1 capsule by mouth daily.  . Omega 3 340 MG CPDR Take by mouth daily.  Marland Kitchen perphenazine (TRILAFON) 2 MG tablet Take by mouth.  . tretinoin (RETIN-A) 0.1 % cream   . Zinc Sulfate 66 MG TABS once daily  . Also, is allergic to oxcarbazepine; cefuroxime; citric acid; eggs or egg-derived products; flax [bio-flax]; ginger; lac bovis; sulfa antibiotics; wheat bran; latex; and other..  Review of Systems  All other systems reviewed and  are negative.    Observations/Objective: No exam today, due to telephone eVisit due to [facility] virus restriction on elective visits and procedures.  Prior visits reviewed along with ultrasounds/labs as indicated.  Assessment and Plan:   ICD-10-CM   1. Vaginal atrophy  N95.2   Cont Premarin vaginal cream 2 x weekly Add Prometrium daily to protect uterus Monitor for side effects  Follow Up  Instructions: For annual in Jan 2022   I discussed the assessment and treatment plan with the patient. The patient was provided an opportunity to ask questions and all were answered. The patient agreed with the plan and demonstrated an understanding of the instructions.   The patient was advised to call back or seek an in-person evaluation if the symptoms worsen or if the condition fails to improve as anticipated.  I provided 20 minutes of non-face-to-face time during this encounter.   Hoyt Koch, MD

## 2020-04-27 ENCOUNTER — Encounter: Payer: Self-pay | Admitting: Dermatology

## 2020-05-04 DIAGNOSIS — E282 Polycystic ovarian syndrome: Secondary | ICD-10-CM | POA: Diagnosis not present

## 2020-05-10 ENCOUNTER — Other Ambulatory Visit: Payer: Self-pay | Admitting: Obstetrics & Gynecology

## 2020-05-10 DIAGNOSIS — N952 Postmenopausal atrophic vaginitis: Secondary | ICD-10-CM

## 2020-05-11 DIAGNOSIS — M461 Sacroiliitis, not elsewhere classified: Secondary | ICD-10-CM | POA: Diagnosis not present

## 2020-05-11 DIAGNOSIS — E282 Polycystic ovarian syndrome: Secondary | ICD-10-CM | POA: Diagnosis not present

## 2020-05-11 DIAGNOSIS — E669 Obesity, unspecified: Secondary | ICD-10-CM | POA: Diagnosis not present

## 2020-05-11 DIAGNOSIS — F250 Schizoaffective disorder, bipolar type: Secondary | ICD-10-CM | POA: Diagnosis not present

## 2020-05-11 DIAGNOSIS — Z6838 Body mass index (BMI) 38.0-38.9, adult: Secondary | ICD-10-CM | POA: Diagnosis not present

## 2020-05-11 DIAGNOSIS — Z0000 Encounter for general adult medical examination without abnormal findings: Secondary | ICD-10-CM | POA: Diagnosis not present

## 2020-05-11 DIAGNOSIS — J029 Acute pharyngitis, unspecified: Secondary | ICD-10-CM | POA: Diagnosis not present

## 2020-06-19 DIAGNOSIS — R319 Hematuria, unspecified: Secondary | ICD-10-CM | POA: Diagnosis not present

## 2020-06-19 DIAGNOSIS — R300 Dysuria: Secondary | ICD-10-CM | POA: Diagnosis not present

## 2020-06-19 DIAGNOSIS — R109 Unspecified abdominal pain: Secondary | ICD-10-CM | POA: Diagnosis not present

## 2020-06-23 DIAGNOSIS — E871 Hypo-osmolality and hyponatremia: Secondary | ICD-10-CM | POA: Diagnosis not present

## 2020-07-17 DIAGNOSIS — F209 Schizophrenia, unspecified: Secondary | ICD-10-CM | POA: Diagnosis not present

## 2020-07-21 DIAGNOSIS — J029 Acute pharyngitis, unspecified: Secondary | ICD-10-CM | POA: Diagnosis not present

## 2020-07-21 DIAGNOSIS — Z20822 Contact with and (suspected) exposure to covid-19: Secondary | ICD-10-CM | POA: Diagnosis not present

## 2020-07-21 DIAGNOSIS — Z9109 Other allergy status, other than to drugs and biological substances: Secondary | ICD-10-CM | POA: Diagnosis not present

## 2020-08-02 ENCOUNTER — Ambulatory Visit: Payer: Medicare HMO | Admitting: Dermatology

## 2020-08-09 DIAGNOSIS — T7840XA Allergy, unspecified, initial encounter: Secondary | ICD-10-CM | POA: Diagnosis not present

## 2020-08-10 ENCOUNTER — Other Ambulatory Visit: Payer: Self-pay

## 2020-08-10 ENCOUNTER — Ambulatory Visit (INDEPENDENT_AMBULATORY_CARE_PROVIDER_SITE_OTHER): Payer: Medicare HMO | Admitting: Dermatology

## 2020-08-10 DIAGNOSIS — D239 Other benign neoplasm of skin, unspecified: Secondary | ICD-10-CM

## 2020-08-10 DIAGNOSIS — D2361 Other benign neoplasm of skin of right upper limb, including shoulder: Secondary | ICD-10-CM | POA: Diagnosis not present

## 2020-08-10 DIAGNOSIS — I8393 Asymptomatic varicose veins of bilateral lower extremities: Secondary | ICD-10-CM | POA: Diagnosis not present

## 2020-08-10 DIAGNOSIS — R58 Hemorrhage, not elsewhere classified: Secondary | ICD-10-CM | POA: Diagnosis not present

## 2020-08-10 DIAGNOSIS — L719 Rosacea, unspecified: Secondary | ICD-10-CM

## 2020-08-10 DIAGNOSIS — L988 Other specified disorders of the skin and subcutaneous tissue: Secondary | ICD-10-CM

## 2020-08-10 DIAGNOSIS — Z1283 Encounter for screening for malignant neoplasm of skin: Secondary | ICD-10-CM | POA: Diagnosis not present

## 2020-08-10 DIAGNOSIS — L821 Other seborrheic keratosis: Secondary | ICD-10-CM

## 2020-08-10 DIAGNOSIS — L578 Other skin changes due to chronic exposure to nonionizing radiation: Secondary | ICD-10-CM

## 2020-08-10 DIAGNOSIS — L709 Acne, unspecified: Secondary | ICD-10-CM

## 2020-08-10 DIAGNOSIS — D1800 Hemangioma unspecified site: Secondary | ICD-10-CM

## 2020-08-10 DIAGNOSIS — D2339 Other benign neoplasm of skin of other parts of face: Secondary | ICD-10-CM | POA: Diagnosis not present

## 2020-08-10 DIAGNOSIS — L304 Erythema intertrigo: Secondary | ICD-10-CM | POA: Diagnosis not present

## 2020-08-10 DIAGNOSIS — D229 Melanocytic nevi, unspecified: Secondary | ICD-10-CM

## 2020-08-10 DIAGNOSIS — L814 Other melanin hyperpigmentation: Secondary | ICD-10-CM | POA: Diagnosis not present

## 2020-08-10 NOTE — Patient Instructions (Addendum)
Recommend daily broad spectrum sunscreen SPF 30+ to sun-exposed areas, reapply every 2 hours as needed. Call for new or changing lesions.  Recommend taking Heliocare sun protection supplement daily in sunny weather for additional sun protection. For maximum protection on the sunniest days, you can take up to 2 capsules of regular Heliocare OR take 1 capsule of Heliocare Ultra. For prolonged exposure (such as a full day in the sun), you can repeat your dose of the supplement 4 hours after your first dose. Heliocare can be purchased at [facility] or at [email or web address].   

## 2020-08-10 NOTE — Progress Notes (Signed)
Follow-Up Visit   Subjective  Samantha Dean is a 64 y.o. female who presents for the following: TBSE and Follow-up (OV 04/13/20).  Patient presents today for TBSE and follow up on OV 04/13/20, with  For acne, she has been using CeraVe cleanser and the tretinoin cream 0.025% on face, getting better.   For Rosacea she is using Metro gel 0.1% gel QD  And is doing much better.  For her eczema, she has been alternating Dial body wash and Dove.   For her Intertrigo she has been using Dial body wash. Doing much better  For Osteoma Cutis, using tretinoin cream and gentle cleanser  The following portions of the chart were reviewed this encounter and updated as appropriate:  Tobacco  Allergies  Meds  Problems  Med Hx  Surg Hx  Fam Hx      Review of Systems:  No other skin or systemic complaints except as noted in HPI or Assessment and Plan.  Objective  Well appearing patient in no apparent distress; mood and affect are within normal limits.  A full examination was performed including scalp, head, eyes, ears, nose, lips, neck, chest, axillae, abdomen, back, buttocks, bilateral upper extremities, bilateral lower extremities, hands, feet, fingers, toes, fingernails, and toenails. All findings within normal limits unless otherwise noted below.  Objective  Face: Trace open comedones  Objective  Mid Face: Mild mid face erythema w/ single inflammatory papule  Objective  Right Shoulder - Posterior: Firm pink/brown papulenodule with dimple sign.   Objective  Bilateral Cheek: Smooth white papules at bilateral cheeks  Objective  Bilateral Leg: Faintly erythematous patches b/l lower leg  Objective  Right Inframammary Fold: Clear today   Assessment & Plan  Acne, unspecified acne type Face  Chronic, doing well.   Continue tretinoin 0.025% cream each night  Topical retinoid medications like tretinoin/Retin-A, adapalene/Differin, tazarotene/Fabior, and Epiduo/Epiduo Forte  can cause dryness and irritation when first started. Only apply a pea-sized amount to the entire affected area. Avoid applying it around the eyes, edges of mouth and creases at the nose. If you experience irritation, use a good moisturizer first and/or apply the medicine less often. If you are doing well with the medicine, you can increase how often you use it until you are applying every night. Be careful with sun protection while using this medication as it can make you sensitive to the sun. This medicine should not be used by pregnant women.    Rosacea Mid Face  Chronic. Doing well. Continue metronidazole cream twice a day  Doing well  Dermatofibroma Right Shoulder - Posterior  Benign-appearing.  Observation.  Call clinic for new or changing lesions.  Recommend daily use of broad spectrum spf 30+ sunscreen to sun-exposed areas.     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 Continue using tretinoin 0.25%, apply pea size amount to affected area, use with moisturizer cream  Asymptomatic varicose veins of both lower extremities Right Lower Leg - Anterior  Benign, observe.    Ecchymosis Bilateral Leg  New onset. Typically resolved within 2 weeks. Favor Ecchymosis by history and exam today however if worsen or persistent, consider biopsy for erythema nodosum.  Erythema intertrigo Right Inframammary Fold  Continue Zeasorb and ketoconazole prn rash.  Lentigines - Scattered tan macules - Discussed due to sun exposure - Benign, observe - Call for any changes  Seborrheic Keratoses - Stuck-on, waxy, tan-brown papules and plaques  - Discussed benign etiology and prognosis. - Observe - Call for  any changes  Melanocytic Nevi - Tan-brown and/or pink-flesh-colored symmetric macules and papules - Benign appearing on exam today - Observation - Call clinic for new or changing moles - Recommend daily use of broad spectrum spf 30+ sunscreen to sun-exposed areas.    Hemangiomas - Red papules - Discussed benign nature - Observe - Call for any changes  Actinic Damage - diffuse scaly erythematous macules with underlying dyspigmentation - Recommend daily broad spectrum sunscreen SPF 30+ to sun-exposed areas, reapply every 2 hours as needed.  - Call for new or changing lesions.  Skin cancer screening performed today.   Return in about 1 year (around 08/10/2021) for TBSE.  I, Donzetta Kohut, CMA, am acting as scribe for Forest Gleason, MD .  Documentation: I have reviewed the above documentation for accuracy and completeness, and I agree with the above.  Forest Gleason, MD

## 2020-08-20 ENCOUNTER — Encounter: Payer: Self-pay | Admitting: Dermatology

## 2020-08-23 ENCOUNTER — Other Ambulatory Visit: Payer: Self-pay | Admitting: Obstetrics & Gynecology

## 2020-08-23 DIAGNOSIS — N952 Postmenopausal atrophic vaginitis: Secondary | ICD-10-CM

## 2020-08-25 DIAGNOSIS — J019 Acute sinusitis, unspecified: Secondary | ICD-10-CM | POA: Diagnosis not present

## 2020-08-25 DIAGNOSIS — J029 Acute pharyngitis, unspecified: Secondary | ICD-10-CM | POA: Diagnosis not present

## 2020-08-25 DIAGNOSIS — R591 Generalized enlarged lymph nodes: Secondary | ICD-10-CM | POA: Diagnosis not present

## 2020-09-07 DIAGNOSIS — Z23 Encounter for immunization: Secondary | ICD-10-CM | POA: Diagnosis not present

## 2020-09-21 DIAGNOSIS — Z9109 Other allergy status, other than to drugs and biological substances: Secondary | ICD-10-CM | POA: Diagnosis not present

## 2020-09-27 DIAGNOSIS — R05 Cough: Secondary | ICD-10-CM | POA: Diagnosis not present

## 2020-09-27 DIAGNOSIS — R0602 Shortness of breath: Secondary | ICD-10-CM | POA: Diagnosis not present

## 2020-09-28 DIAGNOSIS — R0602 Shortness of breath: Secondary | ICD-10-CM | POA: Diagnosis not present

## 2020-09-28 DIAGNOSIS — Z9109 Other allergy status, other than to drugs and biological substances: Secondary | ICD-10-CM | POA: Diagnosis not present

## 2020-10-16 DIAGNOSIS — F209 Schizophrenia, unspecified: Secondary | ICD-10-CM | POA: Diagnosis not present

## 2020-10-26 DIAGNOSIS — Z6841 Body Mass Index (BMI) 40.0 and over, adult: Secondary | ICD-10-CM | POA: Diagnosis not present

## 2020-10-26 DIAGNOSIS — Z9109 Other allergy status, other than to drugs and biological substances: Secondary | ICD-10-CM | POA: Diagnosis not present

## 2020-10-26 DIAGNOSIS — E669 Obesity, unspecified: Secondary | ICD-10-CM | POA: Diagnosis not present

## 2020-11-08 ENCOUNTER — Other Ambulatory Visit: Payer: Self-pay | Admitting: Obstetrics & Gynecology

## 2020-11-08 ENCOUNTER — Telehealth: Payer: Self-pay

## 2020-11-08 MED ORDER — ESTRADIOL 0.1 MG/GM VA CREA: 1.0000 | TOPICAL_CREAM | VAGINAL | 3 refills | Status: DC

## 2020-11-08 NOTE — Telephone Encounter (Signed)
Pt calling to let [facility] know that she has irritation after using the premarin cream.  States PH told her he would rx an alternative if she was allergic to the premarin b/c she has so many allergies.  [facility] [address].  [phone]

## 2020-11-08 NOTE — Telephone Encounter (Signed)
LMTC

## 2020-11-08 NOTE — Telephone Encounter (Signed)
Let her know, unlikely allergy after using for the last 8 months.  May not be as effective for her sx's of irritation, and thus may want to change to alternate preparation.  Cont w the Prometrium pill as well.   Will call in Estrace vaginal cream.

## 2020-11-09 DIAGNOSIS — E282 Polycystic ovarian syndrome: Secondary | ICD-10-CM | POA: Diagnosis not present

## 2020-11-09 NOTE — Telephone Encounter (Signed)
Left msg for pt to return call and ask for me.

## 2020-11-16 DIAGNOSIS — F250 Schizoaffective disorder, bipolar type: Secondary | ICD-10-CM | POA: Diagnosis not present

## 2020-11-16 DIAGNOSIS — Z6841 Body Mass Index (BMI) 40.0 and over, adult: Secondary | ICD-10-CM | POA: Diagnosis not present

## 2020-11-16 DIAGNOSIS — Z0001 Encounter for general adult medical examination with abnormal findings: Secondary | ICD-10-CM | POA: Diagnosis not present

## 2020-11-16 DIAGNOSIS — E669 Obesity, unspecified: Secondary | ICD-10-CM | POA: Diagnosis not present

## 2020-11-16 DIAGNOSIS — J329 Chronic sinusitis, unspecified: Secondary | ICD-10-CM | POA: Diagnosis not present

## 2020-11-16 DIAGNOSIS — E282 Polycystic ovarian syndrome: Secondary | ICD-10-CM | POA: Diagnosis not present

## 2020-11-17 NOTE — Telephone Encounter (Signed)
Left third msg for pt to call and ask for me.

## 2020-11-20 ENCOUNTER — Other Ambulatory Visit: Payer: Self-pay | Admitting: Internal Medicine

## 2020-11-20 DIAGNOSIS — Z1231 Encounter for screening mammogram for malignant neoplasm of breast: Secondary | ICD-10-CM

## 2020-11-22 NOTE — Telephone Encounter (Signed)
Pt hasn't returned call.  Msg closed.

## 2020-12-01 DIAGNOSIS — Z03818 Encounter for observation for suspected exposure to other biological agents ruled out: Secondary | ICD-10-CM | POA: Diagnosis not present

## 2020-12-01 DIAGNOSIS — J019 Acute sinusitis, unspecified: Secondary | ICD-10-CM | POA: Diagnosis not present

## 2021-01-01 ENCOUNTER — Ambulatory Visit
Admission: RE | Admit: 2021-01-01 | Discharge: 2021-01-01 | Disposition: A | Discharge status: Home / Self Care | Payer: Medicare HMO | Source: Ambulatory Visit | Attending: Internal Medicine | Admitting: Internal Medicine

## 2021-01-01 ENCOUNTER — Other Ambulatory Visit: Payer: Self-pay

## 2021-01-01 DIAGNOSIS — Z1231 Encounter for screening mammogram for malignant neoplasm of breast: Secondary | ICD-10-CM

## 2021-01-19 DIAGNOSIS — F209 Schizophrenia, unspecified: Secondary | ICD-10-CM | POA: Diagnosis not present

## 2021-01-22 ENCOUNTER — Ambulatory Visit (INDEPENDENT_AMBULATORY_CARE_PROVIDER_SITE_OTHER): Payer: Medicare HMO | Admitting: Obstetrics & Gynecology

## 2021-01-22 ENCOUNTER — Other Ambulatory Visit (HOSPITAL_COMMUNITY)
Admission: RE | Admit: 2021-01-22 | Discharge: 2021-01-22 | Disposition: A | Discharge status: Home / Self Care | Payer: Medicare HMO | Source: Ambulatory Visit | Attending: Obstetrics & Gynecology | Admitting: Obstetrics & Gynecology

## 2021-01-22 ENCOUNTER — Encounter: Payer: Self-pay | Admitting: Obstetrics & Gynecology

## 2021-01-22 ENCOUNTER — Other Ambulatory Visit: Payer: Self-pay

## 2021-01-22 VITALS — BP 150/90 | Ht 60.0 in | Wt 213.0 lb

## 2021-01-22 DIAGNOSIS — Z124 Encounter for screening for malignant neoplasm of cervix: Secondary | ICD-10-CM | POA: Diagnosis not present

## 2021-01-22 DIAGNOSIS — Z01419 Encounter for gynecological examination (general) (routine) without abnormal findings: Secondary | ICD-10-CM | POA: Diagnosis not present

## 2021-01-22 DIAGNOSIS — Z1211 Encounter for screening for malignant neoplasm of colon: Secondary | ICD-10-CM | POA: Diagnosis not present

## 2021-01-22 DIAGNOSIS — Z1151 Encounter for screening for human papillomavirus (HPV): Secondary | ICD-10-CM | POA: Insufficient documentation

## 2021-01-22 MED ORDER — CLOTRIMAZOLE-BETAMETHASONE 1-0.05 % EX CREA: 1.0000 "application " | TOPICAL_CREAM | Freq: Two times a day (BID) | CUTANEOUS | 11 refills | Status: DC

## 2021-01-22 NOTE — Patient Instructions (Signed)
PAP every three years Mammogram every year Colonoscopy every 10 years Labs yearly (with PCP)  Thank you for choosing [facility]. As part of our ongoing efforts to improve patient experience, we would appreciate your feedback. Please fill out the short survey that you will receive by mail or MyChart. Your opinion is important to us! - Dr. Kairy Folsom  Recommendations to boost your immunity to prevent illness such as viral flu and colds, including covid19, are as follows: Vitamin K2 and Vitamin D3. Take Vitamin K2 at 200-300 mcg daily (usually 2-3 pills daily of the over the counter formulation). Take Vitamin D3 at 3000-4000 U daily (usually 3-4 pills daily of the over the counter formulation). Studies show that these two at high normal levels in your system are very effective in keeping your immunity so strong and protective that you will be unlikely to contract viral illness such as those listed above.  Dr Nashayla Telleria  

## 2021-01-22 NOTE — Progress Notes (Signed)
HPI:      [name] is a 65 y.o. [id number] who LMP was in the past, she presents today for her annual examination.  The patient has no complaints today. The patient is not currently sexually active. Herlast pap: approximate date 2019 and was normal and last mammogram: approximate date 2022 and was normal.  The patient does perform self breast exams.  There is no notable family history of breast or ovarian cancer in her family. The patient is taking hormone replacement therapy, but has stopped recently due to vaginal burning and irritation w medicine (no real difference).  Most of her sx's are external and possibly involve yeast and heat skin rxn.  Patient denies post-menopausal vaginal bleeding.   The patient has regular exercise: yes. The patient denies current symptoms of depression.    GYN Hx: Last Colonoscopy:10 years ago. Normal.  Last DEXA: 2 years ago.    PMHx: Past Medical History:  Diagnosis Date  . Arthritis    knee  . Depression   . GERD (gastroesophageal reflux disease)   . Headache    thinks may have been from tick bite.  None since antibiotic  . Polycystic ovarian syndrome   . Schizophrenia ([place])   . Tick bite    approx 2 wks ago.  has finished round of doxycycline   Past Surgical History:  Procedure Laterality Date  . CESAREAN SECTION    . 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  Marland Kitchen DIAGNOSTIC LAPAROSCOPY  1987  . DILATION AND CURETTAGE OF UTERUS  approx 2004   [facility]  . ESOPHAGOGASTRODUODENOSCOPY (EGD) WITH PROPOFOL N/A 05/15/2016   Procedure: ESOPHAGOGASTRODUODENOSCOPY (EGD) WITH PROPOFOL;  Surgeon: Manya Silvas, MD;  Location[facility] ENDOSCOPY;  Service: Endoscopy;  Laterality: N/A;  . KNEE ARTHROSCOPY Right 07/17/2016    Procedure: ARTHROSCOPY KNEE WITH DEBRIDEMENT AND PARTIAL MEDIAL MENISCECTOMY right knee;  Surgeon: Corky Mull, MD;  Location: [facility];  Service: Orthopedics;  Laterality: Right;  . KNEE ARTHROSCOPY Left 07/09/2017   Procedure: ARTHROSCOPY KNEE  with debridement and medial meniscectomy;  Surgeon: Corky Mull, MD;  Location: [facility];  Service: Orthopedics;  Laterality: Left;  . NOSE SURGERY    . OVARIAN CYST REMOVAL  1986   Family History  Adopted: Yes  Problem Relation Age of Onset  . Pancreatic cancer Mother   . Breast cancer Neg Hx    Social History   Tobacco Use  . Smoking status: Never Smoker  . Smokeless tobacco: Never Used  Vaping Use  . Vaping Use: Never used  Substance Use Topics  . Alcohol use: No  . Drug use: No    Current Outpatient Medications:  .  alendronate (FOSAMAX) 70 MG tablet, Take 70 mg by mouth once a week. Take with a full glass of water on an empty stomach., Disp: , Rfl:  .  b complex vitamins tablet, Take 1 tablet by mouth daily., Disp: , Rfl:  .  Biotin 1 MG CAPS, Take by mouth., Disp: , Rfl:  .  Calcium Carbonate-Vit D-Min (CALCIUM 1200 PO), Take 2,400 mg by mouth daily., Disp: , Rfl:  .  clotrimazole-betamethasone (LOTRISONE) cream, Apply 1 application topically 2 (two) times daily. Use in affected areas for up to two weeks, then take a break from  it., Disp: 30 g, Rfl: 11 .  cyanocobalamin 1000 MCG tablet, Take 1,000 mcg by mouth daily., Disp: , Rfl:  .  escitalopram (LEXAPRO) 20 MG tablet, Take by mouth., Disp: , Rfl:  .  fluticasone (FLONASE) 50 MCG/ACT nasal spray, Place into both nostrils daily., Disp: , Rfl:  .  loratadine (CLARITIN) 10 MG tablet, Take 10 mg by mouth daily., Disp: , Rfl:  .  meloxicam (MOBIC) 15 MG tablet, , Disp: , Rfl: 0 .  metroNIDAZOLE (METROCREAM) 0.75 % cream, APP THIN LAYER TO FACE BID, Disp: , Rfl:  .  montelukast (SINGULAIR) 10 MG tablet, Take by mouth., Disp: , Rfl:  .  Multiple Vitamin  (MULTIVITAMIN) capsule, Take 1 capsule by mouth daily., Disp: , Rfl:  .  Omega 3 340 MG CPDR, Take by mouth daily., Disp: , Rfl:  .  perphenazine (TRILAFON) 2 MG tablet, Take by mouth., Disp: , Rfl:  .  tretinoin (RETIN-A) 0.1 % cream, , Disp: , Rfl:  .  Zinc Sulfate 66 MG TABS, once daily, Disp: , Rfl:  .  azelastine (ASTELIN) 0.1 % nasal spray, Place into the nose., Disp: , Rfl:  .  doxycycline (VIBRA-TABS) 100 MG tablet, , Disp: , Rfl:  Allergies: Oxcarbazepine, Cefuroxime, Citric acid, Eggs or egg-derived products, Flax [bio-flax], Ginger, Lac bovis, Sulfa antibiotics, Wheat bran, Latex, and Other  Review of Systems  Constitutional: Positive for malaise/fatigue. Negative for chills and fever.  HENT: Negative for congestion, sinus pain and sore throat.   Eyes: Negative for blurred vision and pain.  Respiratory: Negative for cough and wheezing.   Cardiovascular: Negative for chest pain and leg swelling.  Gastrointestinal: Negative for abdominal pain, constipation, diarrhea, heartburn, nausea and vomiting.  Genitourinary: Negative for dysuria, frequency, hematuria and urgency.  Musculoskeletal: Negative for back pain, joint pain, myalgias and neck pain.  Skin: Negative for itching and rash.  Neurological: Negative for dizziness, tremors and weakness.  Endo/Heme/Allergies: Does not bruise/bleed easily.  Psychiatric/Behavioral: Positive for depression. The patient is nervous/anxious. The patient does not have insomnia.     Objective: BP (!) 150/90   Ht 5' (1.524 m)   Wt 213 lb (96.6 kg)   BMI 41.60 kg/m   Filed Weights   01/22/21 0809  Weight: 213 lb (96.6 kg)   Body mass index is 41.6 kg/m. Physical Exam Constitutional:      General: She is not in acute distress.    Appearance: She is well-developed and well-nourished.  Genitourinary:     Vagina, uterus and rectum normal.     There is no rash or lesion on the right labia.     There is no rash or lesion on the left labia.     No lesions in the vagina.     No vaginal bleeding.      Right Adnexa: not tender and no mass present.    Left Adnexa: not tender and no mass present.    No cervical motion tenderness, friability, lesion or polyp.     Uterus is mobile.     Uterus is not enlarged.     No uterine mass detected.    Uterus is midaxial.     Pelvic exam was performed with patient supine.  Breasts:     Right: No mass, skin change or tenderness.     Left: No mass, skin change or tenderness.     Breast exam comments: R breast skin lesion, red and draining (scant).   HENT:  Head: Normocephalic and atraumatic. No laceration.     Right Ear: Hearing normal.     Left Ear: Hearing normal.     Nose: No epistaxis or foreign body.     Mouth/Throat:     Mouth: Oropharynx is clear and moist and mucous membranes are normal.     Pharynx: Uvula midline.  Eyes:     Pupils: Pupils are equal, round, and reactive to light.  Neck:     Thyroid: No thyromegaly.  Cardiovascular:     Rate and Rhythm: Normal rate and regular rhythm.     Heart sounds: No murmur heard. No friction rub. No gallop.   Pulmonary:     Effort: Pulmonary effort is normal. No respiratory distress.     Breath sounds: Normal breath sounds. No wheezing.  Abdominal:     General: Bowel sounds are normal. There is no distension.     Palpations: Abdomen is soft.     Tenderness: There is no abdominal tenderness. There is no rebound.  Musculoskeletal:        General: Normal range of motion.     Cervical back: Normal range of motion and neck supple.  Neurological:     Mental Status: She is alert and oriented to person, place, and time.     Cranial Nerves: No cranial nerve deficit.  Skin:    General: Skin is warm and dry.  Psychiatric:        Mood and Affect: Mood and affect normal.        Judgment: Judgment normal.  Vitals reviewed.     Assessment: Annual Exam 1. Women's annual routine gynecological examination   2. Screening for cervical  cancer   3. Screen for colon cancer     Plan:            1.  Cervical Screening-  Pap smear done today  2. Breast screening- Exam annually and mammogram scheduled  3. Colonoscopy every 10 years, Hemoccult testing after age 37  4. Labs managed by PCP  5. Counseling for hormonal therapy: wants to stop due to no effect Will Rx Lotrisone for external use and monitor sx control              6. FRAX - FRAX score for assessing the 10 year probability for fracture calculated and discussed today.  Based on age and score today, DEXA is not currently scheduled.  Plan next year.   7. Skin lesion, right breast.  Dermatology if persists, although may have self treated.    F/U  Return in about 1 year (around 01/22/2022) for Annual.  Barnett Applebaum, MD, [facility], [facility] 01/22/2021  8:48 AM

## 2021-01-23 LAB — CYTOLOGY - PAP
Adequacy: ABSENT
Comment: NEGATIVE
Diagnosis: NEGATIVE
High risk HPV: NEGATIVE

## 2021-02-08 DIAGNOSIS — Z20822 Contact with and (suspected) exposure to covid-19: Secondary | ICD-10-CM | POA: Diagnosis not present

## 2021-02-08 DIAGNOSIS — J0191 Acute recurrent sinusitis, unspecified: Secondary | ICD-10-CM | POA: Diagnosis not present

## 2021-02-22 DIAGNOSIS — J0191 Acute recurrent sinusitis, unspecified: Secondary | ICD-10-CM | POA: Diagnosis not present

## 2021-02-27 ENCOUNTER — Encounter: Payer: Self-pay | Admitting: Obstetrics and Gynecology

## 2021-04-09 ENCOUNTER — Other Ambulatory Visit: Payer: Self-pay | Admitting: Obstetrics & Gynecology

## 2021-04-13 DIAGNOSIS — F209 Schizophrenia, unspecified: Secondary | ICD-10-CM | POA: Diagnosis not present

## 2021-04-23 DIAGNOSIS — M545 Low back pain, unspecified: Secondary | ICD-10-CM | POA: Diagnosis not present

## 2021-04-23 DIAGNOSIS — R21 Rash and other nonspecific skin eruption: Secondary | ICD-10-CM | POA: Diagnosis not present

## 2021-05-08 ENCOUNTER — Other Ambulatory Visit: Payer: Self-pay

## 2021-05-08 ENCOUNTER — Encounter: Payer: Self-pay | Admitting: Physical Therapy

## 2021-05-08 ENCOUNTER — Ambulatory Visit: Payer: Medicare HMO | Attending: Physician Assistant | Admitting: Physical Therapy

## 2021-05-08 DIAGNOSIS — G8929 Other chronic pain: Secondary | ICD-10-CM | POA: Diagnosis not present

## 2021-05-08 DIAGNOSIS — M5441 Lumbago with sciatica, right side: Secondary | ICD-10-CM | POA: Insufficient documentation

## 2021-05-08 NOTE — Therapy (Signed)
[facility] [address], [place], [address] Phone: [phone]   Fax:  [phone]  Physical Therapy Evaluation  Patient Details  Name: Samantha Dean MRN: [id number] Date of Birth: 03-26-1956 No data recorded  Encounter Date: 05/08/2021   PT End of Session - 05/08/21 1051    Visit Number 1    Number of Visits 17    Date for PT Re-Evaluation 07/06/21    PT Start Time 1034    PT Stop Time 1115    PT Time Calculation (min) 41 min    Activity Tolerance Patient tolerated treatment well    Behavior During Therapy [facility] for tasks assessed/performed           Past Medical History:  Diagnosis Date  . Arthritis    knee  . Depression   . Family history of pancreatic cancer    02/27/21 cancer genetic testing letter sent  . GERD (gastroesophageal reflux disease)   . Headache    thinks may have been from tick bite.  None since antibiotic  . Polycystic ovarian syndrome   . Schizophrenia ([place])   . Tick bite    approx 2 wks ago.  has finished round of doxycycline    Past Surgical History:  Procedure Laterality Date  . CESAREAN SECTION    . 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  Marland Kitchen DIAGNOSTIC LAPAROSCOPY  1987  . DILATION AND CURETTAGE OF UTERUS  approx 2004   [facility]  . ESOPHAGOGASTRODUODENOSCOPY (EGD) WITH PROPOFOL N/A 05/15/2016   Procedure: ESOPHAGOGASTRODUODENOSCOPY (EGD) WITH PROPOFOL;  Surgeon: Manya Silvas, MD;  Location: [facility] ENDOSCOPY;  Service: Endoscopy;  Laterality: N/A;  . KNEE ARTHROSCOPY Right 07/17/2016   Procedure: ARTHROSCOPY KNEE WITH DEBRIDEMENT AND PARTIAL MEDIAL MENISCECTOMY right knee;  Surgeon: Corky Mull, MD;  Location: [place];  Service:  Orthopedics;  Laterality: Right;  . KNEE ARTHROSCOPY Left 07/09/2017   Procedure: ARTHROSCOPY KNEE  with debridement and medial meniscectomy;  Surgeon: Corky Mull, MD;  Location: [place];  Service: Orthopedics;  Laterality: Left;  . NOSE SURGERY    . OVARIAN CYST REMOVAL  1986    There were no vitals filed for this visit.    Subjective Assessment - 05/08/21 1045    Pertinent History Pt is a 65 year old female presenting with chronic LBP. She was seen in PT before at [facility] for R sided LBP with sciatica, but had to stop d/t pandemic. Over the past 6 months she has been having increased L sided hip pain (MD attributes to bursitis), midline LBP, and pain in R buttock. Most limited by midline LBP that feels like it is right on her spine. Reports that L and R hips bother her when it is cold outside, or sometimes she gets LBP with R buttock pain. Denies numbess/tingling sensation down LEs. Aggravating factors standing 26mins, lifting 20#, forward bending. Reports pain is better with movement and walking. Pain Current and best: 0/10; worst 6/10. Pt has had no treatment so far for back or hip pain. 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 Pt denies N/V, B&B changes, unexplained weight fluctuation, saddle paresthesia, fever, night sweats, or unrelenting  night pain at this time.    Limitations Lifting;Standing    How long can you sit comfortably? unlimited    How long can you stand comfortably? 35mins    How long can you walk comfortably? 1hour    Diagnostic tests not recently    Patient Stated Goals Being able to complete home program with her peddler    Currently in Pain? Yes    Pain Location Back    Pain Orientation Lower;Medial    Pain Descriptors / Indicators Aching;Sharp   catch   Pain Type Chronic pain    Pain Radiating Towards R hip    Pain Onset More than a month ago    Pain  Frequency Intermittent    Aggravating Factors  standing 21mins, lifting 20#, forward bending.    Pain Relieving Factors walking, rest    Effect of Pain on Daily Activities unable to complete ADLs without pain    Multiple Pain Sites No                OBJECTIVE  Mental Status Patient is oriented to person, place and time.  Recent memory is intact.  Remote memory is intact.  Attention span and concentration are intact.  Expressive speech is intact.  Patient's fund of knowledge is within normal limits for educational level.  SENSATION: Grossly intact to light touch bilateral LEs as determined by testing dermatomes L2-S2 Proprioception and hot/cold testing deferred on this date   MUSCULOSKELETAL: Tremor: None Bulk: Normal Tone: Normal No visible step-off along spinal column  Posture Lumbar lordosis: WNL Iliac crest height: equal bilaterally Lumbar lateral shift: negative Lower crossed syndrome (tight hip flexors and erector spinae; weak gluts and abs): positive  Gait bilat compensated trendelenburg <1.62m/s   Palpation  TTP over bilat greater trochanter L>R with some secondary pain to distal L ITB. Concodant R sided hip pain to R superior glute fibers and deep palpation over mid glute (over piriformis).  Secondary TTP over bilat lumbar ext and QL L>R  Strength (out of 5) R/L 5/5 Hip flexion 4-/4- Hip ER 5/5 Hip IR 3+/4- Hip abduction 5/5 Hip adduction 4-/4- Hip extension 5/5 Knee extension 5/5 Knee flexion 5/5 Ankle dorsiflexion 5/5 Ankle plantarflexion 5 Trunk flexion 5 Trunk extension 5/5 Trunk rotation 5x STS 13sec *Indicates pain   AROM (degrees) R/L (all movements include overpressure unless otherwise stated) All hip/lumbar motion WNL *Indicates pain  PROM (degrees) PROM = AROM  Repeated Movements No centralization or peripheralization of symptoms with repeated lumbar extension or flexion.    Muscle Length Hamstrings: shortened bilat [place]:  negative bilat Thomas: negative bilat Ober: negative bilat   Passive Accessory Intervertebral Motion (PAIVM) Pt denies reproduction of back pain with CPA L1-L5 and UPA bilaterally L1-L5. Generally hypomobile throughout  Passive Physiological Intervertebral Motion (PPIVM) Normal flexion and extension with PPIVM testing   SPECIAL TESTS Lumbar Radiculopathy and Discogenic: Centralization and Peripheralization (SN 92, -LR 0.12): Negative bilat Slump (SN 83, -LR 0.32): Negative bilat SLR (SN 92, -LR 0.29): Negative bilat Crossed SLR (SP 90): Negative bilat  Facet Joint: Extension-Rotation (SN 100, -LR 0.0): Negative bilat  Lumbar Spinal Stenosis: Lumbar quadrant (SN 70): Negative bilat  Hip: FABER (SN 81): Negative bilat FADIR (SN 94): Negative bilat Hip scour (SN 50): Negative bilat  SIJ:  Thigh Thrust (SN 88, -LR 0.18) : Negative bilat  Piriformis Syndrome: FAIR Test (SN 88, SP 83): R: positive L negative  Functional Tasks Squat bilat knee valgus and heel lift  Ther-Ex PT  reviewed the following HEP with patient with patient able to demonstrate a set of the following with min cuing for correction needed. PT educated patient on parameters of therex (how/when to inc/decrease intensity, frequency, rep/set range, stretch hold time, and purpose of therex) with verbalized understanding.  Supine Bridge with Resistance Band - 1 x daily - 3-4 x weekly - 3 sets - 10 reps Squat with Chair Touch and Resistance Loop - 1 x daily - 3-4 x weekly - 3 sets - 10 reps Seated Piriformis Stretch - 2-3 x daily - 7 x weekly - 30sec hold  ASSESSMENT Clinical Impression: Pt is a [age] year-old female/female referred for low back pain. PT examination reveals deficits . Pt presents with deficits in strength, mobility, range of motion, and pain. Pt will benefit from skilled PT services to address deficits and return to pain-free function at home and work.        Objective measurements completed on  examination: See above findings.               PT Education - 05/08/21 1050    Education Details Patient was educated on diagnosis, anatomy and pathology involved, prognosis, role of PT, and was given an HEP, demonstrating exercise with proper form following verbal and tactile cues, and was given a paper hand out to continue exercise at home. Pt was educated on and agreed to plan of care.    Person(s) Educated Patient    Methods Explanation;Demonstration;Tactile cues;Verbal cues    Comprehension Verbalized understanding;Returned demonstration;Verbal cues required;Tactile cues required            PT Short Term Goals - 05/08/21 1324      PT SHORT TERM GOAL #1   Title Pt will be independent with HEP in order to improve strength and decrease back pain in order to improve pain-free function at home and work.    Baseline 05/08/21 HEP given    Time 5    Period Weeks    Status New             PT Long Term Goals - 05/08/21 1325      PT LONG TERM GOAL #1   Title Pt will decrease worst back pain as reported on NPRS by at least 2 points in order to demonstrate clinically significant reduction in back pain.    Baseline 05/08/21 6/10    Time 8    Period Weeks    Status New      PT LONG TERM GOAL #2   Title Pt will decrease 5TSTS by at least 3 seconds in order to demonstrate clinically significant improvement in LE strength    Baseline 05/08/21 13sec    Time 8    Period Weeks    Status New      PT LONG TERM GOAL #3   Title Patient will increase FOTO score to 78 to demonstrate predicted increase in functional mobility to complete ADLs    Baseline 05/08/21 39    Time 8    Period Weeks    Status New                  Plan - 05/08/21 1310    Clinical Impression Statement Pt is a 65 year old female presenting with bilat hip and low back pain. Signs and symptoms consistent of bilat hip bursitis. Impairments of bilate glute weakness, decreased core strength, increased  muscle tension of lumbar extensors (and bilat hamstring group), abnormal gait mechanics, decreased BLE  power, and pain. Activity limitations in squatting, bending, lifting, stooping, pronlonged standing; inhibiting full participation in pain free ADLs. Pt will benefit from skilled PT to address aforementioned impairments to return to optimal PLOF.    Personal Factors and Comorbidities Comorbidity 1;Comorbidity 2;Comorbidity 3+;Fitness;Time since onset of injury/illness/exacerbation;Past/Current Experience    Comorbidities schizophrenia, GERD, PCOS, arthritis, chronic pain    Examination-Activity Limitations Squat;Lift;Stairs;Transfers;Stand    Examination-Participation Restrictions Laundry;Cleaning;Meal Prep;Community Activity    Stability/Clinical Decision Making Evolving/Moderate complexity    Clinical Decision Making Moderate    Rehab Potential Good    PT Frequency 2x / week    PT Duration 8 weeks    PT Treatment/Interventions ADLs/Self Care Home Management;Electrical Stimulation;Fluidtherapy;Therapeutic activities;Patient/family education;Energy conservation;Spinal Manipulations;Joint Manipulations;Neuromuscular re-education;Stair training;Traction;Ultrasound;Functional mobility training;Therapeutic exercise;Iontophoresis 4mg /ml Dexamethasone;DME Instruction;Gait training;Balance training;Taping;Passive range of motion;Dry needling;Manual techniques;Cryotherapy;Moist Heat    PT Next Visit Plan HEP review, glute and core strengthening, lifting    PT Home Exercise Plan bridge with band, squat with band, piriformis stretch    Consulted and Agree with Plan of Care Patient           Patient will benefit from skilled therapeutic intervention in order to improve the following deficits and impairments:  Improper body mechanics,Pain,Increased fascial restricitons,Abnormal gait,Decreased coordination,Decreased mobility,Postural dysfunction,Impaired tone,Decreased activity tolerance,Decreased  endurance,Decreased range of motion,Decreased strength,Obesity,Impaired flexibility,Decreased balance,Decreased safety awareness,Difficulty walking  Visit Diagnosis: Chronic right-sided low back pain with right-sided sciatica     Problem List Patient Active Problem List   Diagnosis Date Noted  . Vaginal atrophy 01/18/2020  . Annual physical exam 01/15/2018   Durwin Reges DPT Durwin Reges 05/08/2021, 1:27 PM  [facility] [address] [place], [address] Phone: [phone]   Fax:  [phone]  Name: Samantha Dean MRN: [id number] Date of Birth: 1956/09/12

## 2021-05-09 DIAGNOSIS — E282 Polycystic ovarian syndrome: Secondary | ICD-10-CM | POA: Diagnosis not present

## 2021-05-09 DIAGNOSIS — J029 Acute pharyngitis, unspecified: Secondary | ICD-10-CM | POA: Diagnosis not present

## 2021-05-16 ENCOUNTER — Other Ambulatory Visit: Payer: Self-pay

## 2021-05-16 ENCOUNTER — Encounter: Payer: Self-pay | Admitting: Physical Therapy

## 2021-05-16 ENCOUNTER — Ambulatory Visit: Payer: Medicare HMO | Admitting: Physical Therapy

## 2021-05-16 DIAGNOSIS — Z6841 Body Mass Index (BMI) 40.0 and over, adult: Secondary | ICD-10-CM | POA: Diagnosis not present

## 2021-05-16 DIAGNOSIS — M5441 Lumbago with sciatica, right side: Secondary | ICD-10-CM | POA: Diagnosis not present

## 2021-05-16 DIAGNOSIS — G8929 Other chronic pain: Secondary | ICD-10-CM

## 2021-05-16 DIAGNOSIS — B372 Candidiasis of skin and nail: Secondary | ICD-10-CM | POA: Diagnosis not present

## 2021-05-16 DIAGNOSIS — E282 Polycystic ovarian syndrome: Secondary | ICD-10-CM | POA: Diagnosis not present

## 2021-05-16 DIAGNOSIS — F250 Schizoaffective disorder, bipolar type: Secondary | ICD-10-CM | POA: Diagnosis not present

## 2021-05-16 DIAGNOSIS — Z0000 Encounter for general adult medical examination without abnormal findings: Secondary | ICD-10-CM | POA: Diagnosis not present

## 2021-05-16 DIAGNOSIS — E669 Obesity, unspecified: Secondary | ICD-10-CM | POA: Diagnosis not present

## 2021-05-16 NOTE — Therapy (Signed)
[facility] [address], [place], [address] Phone: [phone]   Fax:  [phone]  Physical Therapy Treatment  Patient Details  Name: Samantha Dean MRN: [id number] Date of Birth: 14-May-1956 No data recorded  Encounter Date: 05/16/2021   PT End of Session - 05/16/21 0915    Visit Number 2    Number of Visits 17    Date for PT Re-Evaluation 07/06/21    PT Start Time 0900    PT Stop Time 0940    PT Time Calculation (min) 40 min    Activity Tolerance Patient tolerated treatment well    Behavior During Therapy [facility] for tasks assessed/performed           Past Medical History:  Diagnosis Date  . Arthritis    knee  . Depression   . Family history of pancreatic cancer    02/27/21 cancer genetic testing letter sent  . GERD (gastroesophageal reflux disease)   . Headache    thinks may have been from tick bite.  None since antibiotic  . Polycystic ovarian syndrome   . Schizophrenia ([place])   . Tick bite    approx 2 wks ago.  has finished round of doxycycline    Past Surgical History:  Procedure Laterality Date  . CESAREAN SECTION    . 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  Marland Kitchen DIAGNOSTIC LAPAROSCOPY  1987  . DILATION AND CURETTAGE OF UTERUS  approx 2004   [facility]  . ESOPHAGOGASTRODUODENOSCOPY (EGD) WITH PROPOFOL N/A 05/15/2016   Procedure: ESOPHAGOGASTRODUODENOSCOPY (EGD) WITH PROPOFOL;  Surgeon: Manya Silvas, MD;  Location: [facility] ENDOSCOPY;  Service: Endoscopy;  Laterality: N/A;  . KNEE ARTHROSCOPY Right 07/17/2016   Procedure: ARTHROSCOPY KNEE WITH DEBRIDEMENT AND PARTIAL MEDIAL MENISCECTOMY right knee;  Surgeon: Corky Mull, MD;  Location: [place];  Service:  Orthopedics;  Laterality: Right;  . KNEE ARTHROSCOPY Left 07/09/2017   Procedure: ARTHROSCOPY KNEE  with debridement and medial meniscectomy;  Surgeon: Corky Mull, MD;  Location: [place];  Service: Orthopedics;  Laterality: Left;  . NOSE SURGERY    . OVARIAN CYST REMOVAL  1986    There were no vitals filed for this visit.   Subjective Assessment - 05/16/21 0906    Subjective Patient reports she had one episode of "bad LBP" after standing for a while helping her elderly parents. She is having 2/10 left sided LBP this am. Reports she has not had time to complete her HEP yet.    Pertinent History Pt is a 65 year old female presenting with chronic LBP. She was seen in PT before at [facility] for R sided LBP with sciatica, but had to stop d/t pandemic. Over the past 6 months she has been having increased L sided hip pain (MD attributes to bursitis), midline LBP, and pain in R buttock. Most limited by midline LBP that feels like it is right on her spine. Reports that L and R hips bother her when it is cold outside, or sometimes she gets LBP with R buttock pain. Denies numbess/tingling sensation down LEs. Aggravating factors standing 20mins, lifting 20#, forward bending. Reports pain is better with movement and walking. Pain Current and best: 0/10; worst 6/10. Pt has had no treatment so far  for back or hip pain. 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 Pt denies N/V, B&B changes, unexplained weight fluctuation, saddle paresthesia, fever, night sweats, or unrelenting night pain at this time.    Limitations Lifting;Standing    How long can you sit comfortably? unlimited    How long can you stand comfortably? 8mins    How long can you walk comfortably? 1hour    Diagnostic tests not recently    Patient Stated Goals Being able to complete home program with her peddler    Pain Onset More than a month  ago              Ther-Ex Nustep L3 seat 5 UE 9 49mis for gentle lumbar motion and strengthening cuing for SPM >60spm Supine Bridge with RTB x10; GTB 2 x10 with cuing for set up with good carry over Squat with Chair Touch with GTB 3x 10 with min cuing for initial technique with good carry over following Post pelvic tilt x15 x3sec hold with good carry over of multimodal cuing for success of neutral lumbar spine, carried over into sitting and standing 2min each with good demo of neutral spine posture  Squat with PVC pipe held at occiput and sacrum with T12 contact x12; carried over into functional squat of 10# DB off and onto floor x10 with cuing initially with good carry over Seated Piriformis Stretch 30sec                        PT Education - 05/16/21 0914    Education Details therex form/technique    Person(s) Educated Patient    Methods Explanation;Demonstration;Verbal cues    Comprehension Verbalized understanding;Returned demonstration;Verbal cues required            PT Short Term Goals - 05/08/21 1324      PT SHORT TERM GOAL #1   Title Pt will be independent with HEP in order to improve strength and decrease back pain in order to improve pain-free function at home and work.    Baseline 05/08/21 HEP given    Time 5    Period Weeks    Status New             PT Long Term Goals - 05/08/21 1325      PT LONG TERM GOAL #1   Title Pt will decrease worst back pain as reported on NPRS by at least 2 points in order to demonstrate clinically significant reduction in back pain.    Baseline 05/08/21 6/10    Time 8    Period Weeks    Status New      PT LONG TERM GOAL #2   Title Pt will decrease 5TSTS by at least 3 seconds in order to demonstrate clinically significant improvement in LE strength    Baseline 05/08/21 13sec    Time 8    Period Weeks    Status New      PT LONG TERM GOAL #3   Title Patient will increase FOTO score to 78 to demonstrate  predicted increase in functional mobility to complete ADLs    Baseline 05/08/21 39    Time 8    Period Weeks    Status New                 Plan - 05/16/21 0919    Clinical Impression Statement PT initiated therex progression  for increased lateral hip and core strenghtening with success. Patient is able to comply with multimodal cuing for proper technique of all therex with good motivation throughout session. Patient reports no increased pain throughout session with predicted muscle soreness. PT educated patient on neutral standing posture with core activaiton to prevent LBP/stress in periods of prolonged standing with good understanding. PT reviewed safe, efficient lifting mechanics with patient, which she is able to demonstrate understanding of. PT encouraged patient to utilize these postures and lifting mechanics into ADLS. PT will continue progression as able.    Personal Factors and Comorbidities Comorbidity 1;Comorbidity 2;Comorbidity 3+;Fitness;Time since onset of injury/illness/exacerbation;Past/Current Experience    Comorbidities schizophrenia, GERD, PCOS, arthritis, chronic pain    Examination-Activity Limitations Squat;Lift;Stairs;Transfers;Stand    Examination-Participation Restrictions Laundry;Cleaning;Meal Prep;Community Activity    Stability/Clinical Decision Making Evolving/Moderate complexity    Clinical Decision Making Moderate    Rehab Potential Good    PT Frequency 2x / week    PT Duration 8 weeks    PT Treatment/Interventions ADLs/Self Care Home Management;Electrical Stimulation;Fluidtherapy;Therapeutic activities;Patient/family education;Energy conservation;Spinal Manipulations;Joint Manipulations;Neuromuscular re-education;Stair training;Traction;Ultrasound;Functional mobility training;Therapeutic exercise;Iontophoresis 4mg /ml Dexamethasone;DME Instruction;Gait training;Balance training;Taping;Passive range of motion;Dry needling;Manual techniques;Cryotherapy;Moist Heat     PT Next Visit Plan glute and core strengthening, review lifting mechanics and posture    PT Home Exercise Plan bridge with band, squat with band, piriformis stretch    Consulted and Agree with Plan of Care Patient           Patient will benefit from skilled therapeutic intervention in order to improve the following deficits and impairments:  Improper body mechanics,Pain,Increased fascial restricitons,Abnormal gait,Decreased coordination,Decreased mobility,Postural dysfunction,Impaired tone,Decreased activity tolerance,Decreased endurance,Decreased range of motion,Decreased strength,Obesity,Impaired flexibility,Decreased balance,Decreased safety awareness,Difficulty walking  Visit Diagnosis: Chronic right-sided low back pain with right-sided sciatica     Problem List Patient Active Problem List   Diagnosis Date Noted  . Vaginal atrophy 01/18/2020  . Annual physical exam 01/15/2018   Durwin Reges DPT Durwin Reges 05/16/2021, 9:38 AM  [facility] [address], [place], [address] Phone: [phone]   Fax:  [phone]  Name: Samantha Dean MRN: [id number] Date of Birth: [birth year]

## 2021-05-18 ENCOUNTER — Encounter: Payer: Self-pay | Admitting: Physical Therapy

## 2021-05-18 ENCOUNTER — Other Ambulatory Visit: Payer: Self-pay

## 2021-05-18 ENCOUNTER — Ambulatory Visit: Payer: Medicare HMO | Admitting: Physical Therapy

## 2021-05-18 DIAGNOSIS — G8929 Other chronic pain: Secondary | ICD-10-CM | POA: Diagnosis not present

## 2021-05-18 DIAGNOSIS — M5441 Lumbago with sciatica, right side: Secondary | ICD-10-CM | POA: Diagnosis not present

## 2021-05-18 NOTE — Therapy (Signed)
[name] [address] [place], [address] Phone: [phone]   Fax:  [phone]  Physical Therapy Treatment  Patient Details  Name: Samantha Dean MRN: [id number] Date of Birth: 1956/04/21 No data recorded  Encounter Date: 05/18/2021   PT End of Session - 05/18/21 0941    Visit Number 3    Number of Visits 17    Date for PT Re-Evaluation 07/06/21    PT Start Time 0930    PT Stop Time 1008    PT Time Calculation (min) 38 min    Activity Tolerance Patient tolerated treatment well    Behavior During Therapy [facility] for tasks assessed/performed           Past Medical History:  Diagnosis Date  . Arthritis    knee  . Depression   . Family history of pancreatic cancer    02/27/21 cancer genetic testing letter sent  . GERD (gastroesophageal reflux disease)   . Headache    thinks may have been from tick bite.  None since antibiotic  . Polycystic ovarian syndrome   . Schizophrenia ([place])   . Tick bite    approx 2 wks ago.  has finished round of doxycycline    Past Surgical History:  Procedure Laterality Date  . CESAREAN SECTION    . 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  Marland Kitchen DIAGNOSTIC LAPAROSCOPY  1987  . DILATION AND CURETTAGE OF UTERUS  approx 2004   [facility]  . ESOPHAGOGASTRODUODENOSCOPY (EGD) WITH PROPOFOL N/A 05/15/2016   Procedure: ESOPHAGOGASTRODUODENOSCOPY (EGD) WITH PROPOFOL;  Surgeon: Manya Silvas, MD;  Location: [facility] ENDOSCOPY;  Service: Endoscopy;  Laterality: N/A;  . KNEE ARTHROSCOPY Right 07/17/2016   Procedure: ARTHROSCOPY KNEE WITH DEBRIDEMENT AND PARTIAL MEDIAL MENISCECTOMY right knee;  Surgeon: Corky Mull, MD;  Location: [place];  Service:  Orthopedics;  Laterality: Right;  . KNEE ARTHROSCOPY Left 07/09/2017   Procedure: ARTHROSCOPY KNEE  with debridement and medial meniscectomy;  Surgeon: Corky Mull, MD;  Location: [place];  Service: Orthopedics;  Laterality: Left;  . NOSE SURGERY    . OVARIAN CYST REMOVAL  1986    There were no vitals filed for this visit.   Subjective Assessment - 05/18/21 0933    Subjective Patient reports her legs were a little sore after last visit, but her back felt better, reports only 3/10 pain today. Has not completed HEP since last visit, PT encouraged to continue mobility therex on days off, with understanding. Pt reports she has been working on her posture and lifting mechanics and this is going well.    Pertinent History Pt is a 65 year old female presenting with chronic LBP. She was seen in PT before at [facility] for R sided LBP with sciatica, but had to stop d/t pandemic. Over the past 6 months she has been having increased L sided hip pain (MD attributes to bursitis), midline LBP, and pain in R buttock. Most limited by midline LBP that feels like it is right on her spine. Reports that L and R hips bother her when it is cold outside, or sometimes she gets LBP with R buttock pain. Denies numbess/tingling sensation down LEs. Aggravating factors standing 3mins, lifting 20#, forward bending. Reports pain is better  with movement and walking. Pain Current and best: 0/10; worst 6/10. Pt has had no treatment so far for back or hip pain. 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 Pt denies N/V, B&B changes, unexplained weight fluctuation, saddle paresthesia, fever, night sweats, or unrelenting night pain at this time.    Limitations Lifting;Standing    How long can you sit comfortably? unlimited    How long can you stand comfortably? 28mins    How long can you walk comfortably? 1hour    Diagnostic tests  not recently    Patient Stated Goals Being able to complete home program with her peddler    Pain Onset More than a month ago             Ther-Ex Nustep L3 seat 5 UE 9 6mis for gentle lumbar motion and strengthening cuing for SPM >60spm Supine Bridge with GTB on bosu ball (hardside) 3 x10 with cuing for set up with good carry over Squat with Chair Touch with GTB 10# DB x10; 15# 2x 10 with min cuing initial for prevention of excessive forward trunk flex Hip hinge with PVC pipe held at occiput and sacrum with T12 contact x12 with excellent carry over of technique following cuing; carried over into functional liftof 10# DB off and onto floor x10; 15# s10 with cuing initially with PVC pipe, good carry over Side stepping with GTB x12 each direction with min cuing initially for hip/knee/ankle alignment with good carry over following Seated Piriformis Stretch 30sec                          PT Education - 05/18/21 0940    Education Details therex form/technique    Person(s) Educated Patient    Methods Explanation;Demonstration;Verbal cues    Comprehension Verbalized understanding;Returned demonstration;Verbal cues required            PT Short Term Goals - 05/08/21 1324      PT SHORT TERM GOAL #1   Title Pt will be independent with HEP in order to improve strength and decrease back pain in order to improve pain-free function at home and work.    Baseline 05/08/21 HEP given    Time 5    Period Weeks    Status New             PT Long Term Goals - 05/08/21 1325      PT LONG TERM GOAL #1   Title Pt will decrease worst back pain as reported on NPRS by at least 2 points in order to demonstrate clinically significant reduction in back pain.    Baseline 05/08/21 6/10    Time 8    Period Weeks    Status New      PT LONG TERM GOAL #2   Title Pt will decrease 5TSTS by at least 3 seconds in order to demonstrate clinically significant improvement in LE strength     Baseline 05/08/21 13sec    Time 8    Period Weeks    Status New      PT LONG TERM GOAL #3   Title Patient will increase FOTO score to 78 to demonstrate predicted increase in functional mobility to complete ADLs    Baseline 05/08/21 39    Time 8    Period Weeks    Status New  Plan - 05/18/21 0951    Clinical Impression Statement Pt continued therex progression for increased lateral hip and core strengthening with success. Patient is able to demonstrate good carry over of posture corrections from last session to allow for progression of funcitonal lifting techniques. Patien tis able to comply with all cuing for proper technqiue of therex with good motivation and no increased pain throughout session. PT will continue progression as able.    Personal Factors and Comorbidities Comorbidity 1;Comorbidity 2;Comorbidity 3+;Fitness;Time since onset of injury/illness/exacerbation;Past/Current Experience    Comorbidities schizophrenia, GERD, PCOS, arthritis, chronic pain    Examination-Activity Limitations Squat;Lift;Stairs;Transfers;Stand    Examination-Participation Restrictions Laundry;Cleaning;Meal Prep;Community Activity    Stability/Clinical Decision Making Evolving/Moderate complexity    Clinical Decision Making Moderate    Rehab Potential Good    PT Frequency 2x / week    PT Duration 8 weeks    PT Treatment/Interventions ADLs/Self Care Home Management;Electrical Stimulation;Fluidtherapy;Therapeutic activities;Patient/family education;Energy conservation;Spinal Manipulations;Joint Manipulations;Neuromuscular re-education;Stair training;Traction;Ultrasound;Functional mobility training;Therapeutic exercise;Iontophoresis 4mg /ml Dexamethasone;DME Instruction;Gait training;Balance training;Taping;Passive range of motion;Dry needling;Manual techniques;Cryotherapy;Moist Heat    PT Next Visit Plan glute and core strengthening, review lifting mechanics and posture    PT Home Exercise  Plan bridge with band, squat with band, piriformis stretch    Consulted and Agree with Plan of Care Patient           Patient will benefit from skilled therapeutic intervention in order to improve the following deficits and impairments:  Improper body mechanics,Pain,Increased fascial restricitons,Abnormal gait,Decreased coordination,Decreased mobility,Postural dysfunction,Impaired tone,Decreased activity tolerance,Decreased endurance,Decreased range of motion,Decreased strength,Obesity,Impaired flexibility,Decreased balance,Decreased safety awareness,Difficulty walking  Visit Diagnosis: Chronic right-sided low back pain with right-sided sciatica     Problem List Patient Active Problem List   Diagnosis Date Noted  . Vaginal atrophy 01/18/2020  . Annual physical exam 01/15/2018   Durwin Reges DPT Durwin Reges 05/18/2021, 10:08 AM  [facility] [address], [place], [address] Phone: [phone]   Fax:  [phone]  Name: Samantha Dean MRN: [id number] Date of Birth: 1956-12-21

## 2021-05-21 ENCOUNTER — Ambulatory Visit: Payer: Medicare HMO

## 2021-05-21 ENCOUNTER — Other Ambulatory Visit: Payer: Self-pay

## 2021-05-21 DIAGNOSIS — G8929 Other chronic pain: Secondary | ICD-10-CM

## 2021-05-21 DIAGNOSIS — M5441 Lumbago with sciatica, right side: Secondary | ICD-10-CM | POA: Diagnosis not present

## 2021-05-21 NOTE — Therapy (Signed)
[facility] [address], [place], [address] Phone: [phone]   Fax:  [phone]  Physical Therapy Treatment  Patient Details  Name: Samantha Dean MRN: [id number] Date of Birth: 1956-04-06 No data recorded  Encounter Date: 05/21/2021   PT End of Session - 05/21/21 0912    Visit Number 4    Number of Visits 17    Date for PT Re-Evaluation 07/06/21    Authorization Type [employer]    Authorization Time Period 05/08/21-07/07/21    Authorization - Visit Number 4    Authorization - Number of Visits 16    PT Start Time 0908    PT Stop Time 0940    PT Time Calculation (min) 32 min    Activity Tolerance Patient tolerated treatment well    Behavior During Therapy [facility] for tasks assessed/performed           Past Medical History:  Diagnosis Date  . Arthritis    knee  . Depression   . Family history of pancreatic cancer    02/27/21 cancer genetic testing letter sent  . GERD (gastroesophageal reflux disease)   . Headache    thinks may have been from tick bite.  None since antibiotic  . Polycystic ovarian syndrome   . Schizophrenia ([place])   . Tick bite    approx 2 wks ago.  has finished round of doxycycline    Past Surgical History:  Procedure Laterality Date  . CESAREAN SECTION    . 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  Marland Kitchen DIAGNOSTIC LAPAROSCOPY  1987  . DILATION AND CURETTAGE OF UTERUS  approx 2004   [facility]  . ESOPHAGOGASTRODUODENOSCOPY (EGD) WITH PROPOFOL N/A 05/15/2016   Procedure: ESOPHAGOGASTRODUODENOSCOPY (EGD) WITH PROPOFOL;  Surgeon: Manya Silvas, MD;  Location: [facility] ENDOSCOPY;  Service: Endoscopy;  Laterality: N/A;  . KNEE ARTHROSCOPY Right 07/17/2016    Procedure: ARTHROSCOPY KNEE WITH DEBRIDEMENT AND PARTIAL MEDIAL MENISCECTOMY right knee;  Surgeon: Corky Mull, MD;  Location: [place];  Service: Orthopedics;  Laterality: Right;  . KNEE ARTHROSCOPY Left 07/09/2017   Procedure: ARTHROSCOPY KNEE  with debridement and medial meniscectomy;  Surgeon: Corky Mull, MD;  Location: [place];  Service: Orthopedics;  Laterality: Left;  . NOSE SURGERY    . OVARIAN CYST REMOVAL  1986    There were no vitals filed for this visit.   Subjective Assessment - 05/21/21 0911    Subjective Pt reports still sore after Friday PT session, about a 4/10 this date. Pt has not had a chance to work on her exercises at home as she is veyr much occupied performing housework and IADL for her elderly parents.    Pertinent History Pt is a 65 year old female presenting with chronic LBP. She was seen in PT before at [facility] for R sided LBP with sciatica, but had to stop d/t pandemic. Over the past 6 months she has been having increased L sided hip pain (MD attributes to bursitis), midline LBP, and pain in R buttock. Most limited by midline LBP that feels like it is right on her spine. Reports that L and R hips bother her when it is cold outside, or sometimes she gets LBP with R buttock pain. Denies  numbess/tingling sensation down LEs. Aggravating factors standing 3mins, lifting 20#, forward bending. Reports pain is better with movement and walking. Pain Current and best: 0/10; worst 6/10. Pt has had no treatment so far for back or hip pain. 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 Pt denies N/V, B&B changes, unexplained weight fluctuation, saddle paresthesia, fever, night sweats, or unrelenting night pain at this time.    Currently in Pain? Yes    Pain Score 4           INTERVENTION: -Nustep L3 seat 5 UE 9 86mis for gentle lumbar motion and strengthening  cuing for SPM >60spm -Squat with Chair Tap with GTB at knees for varus propriocetpion training, front loaded 10# DB 1x10 -dynadisc siting with alternate marching greenTB at knees 1x20  -seated FABER stretch 1x30 bilat  -Squat with Chair Tap with GTB at knees for varus propriocetpion training, front loaded 10# DB 1x10 -dynadisc siting with alternate marching greenTB at knees 1x20 -seated FABER stretch 1x30 bilat  -15lb RDL 1x10 unable to achieve form  -PVC training for RDL 2x15 (PVC in a row against sternum, supinated grip) -Side stepping with GTB x12      PT Short Term Goals - 05/08/21 1324      PT SHORT TERM GOAL #1   Title Pt will be independent with HEP in order to improve strength and decrease back pain in order to improve pain-free function at home and work.    Baseline 05/08/21 HEP given    Time 5    Period Weeks    Status New             PT Long Term Goals - 05/08/21 1325      PT LONG TERM GOAL #1   Title Pt will decrease worst back pain as reported on NPRS by at least 2 points in order to demonstrate clinically significant reduction in back pain.    Baseline 05/08/21 6/10    Time 8    Period Weeks    Status New      PT LONG TERM GOAL #2   Title Pt will decrease 5TSTS by at least 3 seconds in order to demonstrate clinically significant improvement in LE strength    Baseline 05/08/21 13sec    Time 8    Period Weeks    Status New      PT LONG TERM GOAL #3   Title Patient will increase FOTO score to 78 to demonstrate predicted increase in functional mobility to complete ADLs    Baseline 05/08/21 39    Time 8    Period Weeks    Status New                 Plan - 05/21/21 0920    Clinical Impression Statement Continued with current plan of care as laid out in evaluation and recent prior sessions. Pt remains motivated to advance progress toward goals. Rest breaks provided as needed, pt quick to ask when needed. Extensive retraining required for RDL form with  success. Session taken easy as pt still sore after PT treatment 4 days prior. Author maintains all interventions within appropriate level of intensity as not to purposefully exacerbate pain. Pt does require varying levels of assistance and cuing for completion of exercises for correct form and sometimes due to pain/weakness. Pt continues to demonstrate progress toward goals AEB progression of some interventions this date either  in volume or intensity. No updates to HEP this date.    Personal Factors and Comorbidities Comorbidity 1;Comorbidity 2;Comorbidity 3+;Fitness;Time since onset of injury/illness/exacerbation;Past/Current Experience    Comorbidities schizophrenia, GERD, PCOS, arthritis, chronic pain    Examination-Activity Limitations Squat;Lift;Stairs;Transfers;Stand    Examination-Participation Restrictions Laundry;Cleaning;Meal Prep;Community Activity    Stability/Clinical Decision Making Stable/Uncomplicated    Clinical Decision Making Moderate    Rehab Potential Good    PT Frequency 2x / week    PT Duration 8 weeks    PT Treatment/Interventions ADLs/Self Care Home Management;Electrical Stimulation;Fluidtherapy;Therapeutic activities;Patient/family education;Energy conservation;Spinal Manipulations;Joint Manipulations;Neuromuscular re-education;Stair training;Traction;Ultrasound;Functional mobility training;Therapeutic exercise;Iontophoresis 4mg /ml Dexamethasone;DME Instruction;Gait training;Balance training;Taping;Passive range of motion;Dry needling;Manual techniques;Cryotherapy;Moist Heat    PT Next Visit Plan glute and core strengthening, review lifting mechanics and posture    PT Home Exercise Plan bridge with band, squat with band, piriformis stretch    Consulted and Agree with Plan of Care Patient           Patient will benefit from skilled therapeutic intervention in order to improve the following deficits and impairments:  Improper body mechanics,Pain,Increased fascial  restricitons,Abnormal gait,Decreased coordination,Decreased mobility,Postural dysfunction,Impaired tone,Decreased activity tolerance,Decreased endurance,Decreased range of motion,Decreased strength,Obesity,Impaired flexibility,Decreased balance,Decreased safety awareness,Difficulty walking  Visit Diagnosis: Chronic right-sided low back pain with right-sided sciatica     Problem List Patient Active Problem List   Diagnosis Date Noted  . Vaginal atrophy 01/18/2020  . Annual physical exam 01/15/2018   9:41 AM, 05/21/21 Etta Grandchild, PT, DPT Physical Therapist - [place] [phone] (Office)  Etta Grandchild 05/21/2021, 9:22 AM  [facility] [address], [place], [address] Phone: [phone]   Fax:  [phone]  Name: LABREA ECCLESTON MRN: [id number] Date of Birth: 11-02-1956

## 2021-05-23 ENCOUNTER — Other Ambulatory Visit: Payer: Self-pay

## 2021-05-23 ENCOUNTER — Ambulatory Visit: Payer: Medicare HMO

## 2021-05-23 DIAGNOSIS — M5441 Lumbago with sciatica, right side: Secondary | ICD-10-CM

## 2021-05-23 DIAGNOSIS — G8929 Other chronic pain: Secondary | ICD-10-CM | POA: Diagnosis not present

## 2021-05-23 NOTE — Therapy (Signed)
[facility] [address] [place], [address] Phone: [phone]   Fax:  [phone]  Physical Therapy Treatment  Patient Details  Name: Samantha Dean MRN: [id number] Date of Birth: 08/23/1956 No data recorded  Encounter Date: 05/23/2021   PT End of Session - 05/23/21 0918    Visit Number 5    Number of Visits 17    Date for PT Re-Evaluation 07/06/21    Authorization Type [employer]    Authorization Time Period 05/08/21-07/07/21    Authorization - Visit Number 5    Authorization - Number of Visits 16    PT Start Time 0915   Pryor Curia starts late   PT Stop Time 0943    PT Time Calculation (min) 28 min    Activity Tolerance Patient tolerated treatment well;No increased pain    Behavior During Therapy WFL for tasks assessed/performed           Past Medical History:  Diagnosis Date  . Arthritis    knee  . Depression   . Family history of pancreatic cancer    02/27/21 cancer genetic testing letter sent  . GERD (gastroesophageal reflux disease)   . Headache    thinks may have been from tick bite.  None since antibiotic  . Polycystic ovarian syndrome   . Schizophrenia ([place])   . Tick bite    approx 2 wks ago.  has finished round of doxycycline    Past Surgical History:  Procedure Laterality Date  . CESAREAN SECTION    . 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  Marland Kitchen DIAGNOSTIC LAPAROSCOPY  1987  . DILATION AND CURETTAGE OF UTERUS  approx 2004   [facility]  . ESOPHAGOGASTRODUODENOSCOPY (EGD) WITH PROPOFOL N/A 05/15/2016   Procedure: ESOPHAGOGASTRODUODENOSCOPY (EGD) WITH PROPOFOL;  Surgeon: Manya Silvas, MD;  Location: [facility] ENDOSCOPY;  Service: Endoscopy;  Laterality: N/A;   . KNEE ARTHROSCOPY Right 07/17/2016   Procedure: ARTHROSCOPY KNEE WITH DEBRIDEMENT AND PARTIAL MEDIAL MENISCECTOMY right knee;  Surgeon: Corky Mull, MD;  Location: [facility];  Service: Orthopedics;  Laterality: Right;  . KNEE ARTHROSCOPY Left 07/09/2017   Procedure: ARTHROSCOPY KNEE  with debridement and medial meniscectomy;  Surgeon: Corky Mull, MD;  Location: [facility];  Service: Orthopedics;  Laterality: Left;  . NOSE SURGERY    . OVARIAN CYST REMOVAL  1986    There were no vitals filed for this visit.   Subjective Assessment - 05/23/21 0918    Subjective Pt feeling better today, just a bit sore, but no pain after last session. Still preoccupied as a caregiver and has not had any time to work on HEP/.    Pertinent History Pt is a 65 year old female presenting with chronic LBP. She was seen in PT before at [facility] for R sided LBP with sciatica, but had to stop d/t pandemic. Over the past 6 months she has been having increased L sided hip pain (MD attributes to bursitis), midline LBP, and pain in R buttock. Most limited by midline LBP that feels like it is right on her spine. Reports that L and R hips bother her when it is cold outside, or sometimes she gets LBP with R buttock pain. Denies numbess/tingling sensation down LEs. Aggravating  factors standing 37mins, lifting 20#, forward bending. Reports pain is better with movement and walking. Pain Current and best: 0/10; worst 6/10. Pt has had no treatment so far for back or hip pain. 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 Pt denies N/V, B&B changes, unexplained weight fluctuation, saddle paresthesia, fever, night sweats, or unrelenting night pain at this time.    Currently in Pain? No/denies           INTERVENTION THIS DATE:  -Nustep L3 seat 5 UE 9 39mis for gentle lumbar motion and strengthening cuing for SPM  >60spm  -Squat with Chair Tap front loaded 10# DB 1x10 -dynadisc siting with alternate marching greenTB at knees 1x20  -chair sitting RDL, un resisted 1x10  -Squat with Chair Tap front loaded 10# DB 1x10 -dynadisc siting with alternate marching greenTB at knees 1x20 -chair sitting RDL, 2kg ball with upward toss/catch at top 1x10  -semitandem Palov press isometric 10x5sec  -Good mornings with PVC in back load squat position, width to comfort, forward gaze 1x15  -Lateral side stepping with blue TB       PT Short Term Goals - 05/08/21 1324      PT SHORT TERM GOAL #1   Title Pt will be independent with HEP in order to improve strength and decrease back pain in order to improve pain-free function at home and work.    Baseline 05/08/21 HEP given    Time 5    Period Weeks    Status New             PT Long Term Goals - 05/08/21 1325      PT LONG TERM GOAL #1   Title Pt will decrease worst back pain as reported on NPRS by at least 2 points in order to demonstrate clinically significant reduction in back pain.    Baseline 05/08/21 6/10    Time 8    Period Weeks    Status New      PT LONG TERM GOAL #2   Title Pt will decrease 5TSTS by at least 3 seconds in order to demonstrate clinically significant improvement in LE strength    Baseline 05/08/21 13sec    Time 8    Period Weeks    Status New      PT LONG TERM GOAL #3   Title Patient will increase FOTO score to 78 to demonstrate predicted increase in functional mobility to complete ADLs    Baseline 05/08/21 39    Time 8    Period Weeks    Status New                 Plan - 05/23/21 0919    Clinical Impression Statement Continued with current plan of care as laid out in evaluation and recent prior sessions. Pt remains motivated to advance progress toward goals. Author maintains all interventions within appropriate level of intensity as not to purposefully exacerbate pain. Pt does require varying levels of assistance  and cuing for completion of exercises for correct form and sometimes due to pain/weakness. Moved from RDL to good morning this date due to historical difficulty with form, much easier to learn, questionable sciatic tension versus hamstrings tension, but no pain. Pt continues to demonstrate progress toward goals AEB progression of some interventions this date either in volume or intensity- moving to blue band from green, etc. No updates to HEP this date.  Personal Factors and Comorbidities Comorbidity 1;Comorbidity 2;Comorbidity 3+;Fitness;Time since onset of injury/illness/exacerbation;Past/Current Experience    Comorbidities schizophrenia, GERD, PCOS, arthritis, chronic pain    Examination-Activity Limitations Squat;Lift;Stairs;Transfers;Stand    Examination-Participation Restrictions Cleaning    Stability/Clinical Decision Making Stable/Uncomplicated    Clinical Decision Making Moderate    Rehab Potential Good    PT Frequency 2x / week    PT Duration 8 weeks    PT Treatment/Interventions ADLs/Self Care Home Management;Electrical Stimulation;Fluidtherapy;Therapeutic activities;Patient/family education;Energy conservation;Spinal Manipulations;Joint Manipulations;Neuromuscular re-education;Stair training;Traction;Ultrasound;Functional mobility training;Therapeutic exercise;Iontophoresis 4mg /ml Dexamethasone;DME Instruction;Gait training;Balance training;Taping;Passive range of motion;Dry needling;Manual techniques;Cryotherapy;Moist Heat    PT Next Visit Plan glute and core strengthening, review lifting mechanics and posture    PT Home Exercise Plan bridge with band, squat with band, piriformis stretch    Consulted and Agree with Plan of Care Patient           Patient will benefit from skilled therapeutic intervention in order to improve the following deficits and impairments:  Improper body mechanics,Pain,Increased fascial restricitons,Abnormal gait,Decreased coordination,Decreased  mobility,Postural dysfunction,Impaired tone,Decreased activity tolerance,Decreased endurance,Decreased range of motion,Decreased strength,Obesity,Impaired flexibility,Decreased balance,Decreased safety awareness,Difficulty walking  Visit Diagnosis: Chronic right-sided low back pain with right-sided sciatica     Problem List Patient Active Problem List   Diagnosis Date Noted  . Vaginal atrophy 01/18/2020  . Annual physical exam 01/15/2018   9:44 AM, 05/23/21 Etta Grandchild, PT, DPT Physical Therapist - [place] [phone] (Office)    Sachin Ferencz C 05/23/2021, 9:21 AM  [facility] [address] [place], [address] Phone: [phone]   Fax:  [phone]  Name: LAURAANN MISSEY MRN: [id number] Date of Birth: 1956-08-03

## 2021-05-29 ENCOUNTER — Encounter: Payer: Self-pay | Admitting: Physical Therapy

## 2021-05-29 ENCOUNTER — Other Ambulatory Visit: Payer: Self-pay

## 2021-05-29 ENCOUNTER — Ambulatory Visit: Payer: Medicare HMO | Admitting: Physical Therapy

## 2021-05-29 DIAGNOSIS — G8929 Other chronic pain: Secondary | ICD-10-CM | POA: Diagnosis not present

## 2021-05-29 DIAGNOSIS — M5441 Lumbago with sciatica, right side: Secondary | ICD-10-CM | POA: Diagnosis not present

## 2021-05-29 NOTE — Therapy (Signed)
[facility] [address], [place], [address] Phone: [phone]   Fax:  [phone]  Physical Therapy Treatment  Patient Details  Name: Samantha Dean MRN: [id number] Date of Birth: 07/16/1956 No data recorded  Encounter Date: 05/29/2021   PT End of Session - 05/29/21 0915    Visit Number 6    Number of Visits 17    Date for PT Re-Evaluation 07/06/21    Authorization Type [employer] H[employer] 16 visits    Authorization Time Period 05/08/21-07/07/21    Authorization - Visit Number 6    Authorization - Number of Visits 10    PT Start Time 0904    PT Stop Time 0945    PT Time Calculation (min) 41 min    Activity Tolerance Patient tolerated treatment well;No increased pain    Behavior During Therapy WFL for tasks assessed/performed           Past Medical History:  Diagnosis Date  . Arthritis    knee  . Depression   . Family history of pancreatic cancer    02/27/21 cancer genetic testing letter sent  . GERD (gastroesophageal reflux disease)   . Headache    thinks may have been from tick bite.  None since antibiotic  . Polycystic ovarian syndrome   . Schizophrenia ([place])   . Tick bite    approx 2 wks ago.  has finished round of doxycycline    Past Surgical History:  Procedure Laterality Date  . CESAREAN SECTION    . 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  Marland Kitchen DIAGNOSTIC LAPAROSCOPY  1987  . DILATION AND CURETTAGE OF UTERUS  approx 2004   [facility]  . ESOPHAGOGASTRODUODENOSCOPY (EGD) WITH PROPOFOL N/A 05/15/2016   Procedure: ESOPHAGOGASTRODUODENOSCOPY (EGD) WITH PROPOFOL;  Surgeon: Manya Silvas, MD;  Location: [facility] ENDOSCOPY;  Service: Endoscopy;  Laterality: N/A;  . KNEE  ARTHROSCOPY Right 07/17/2016   Procedure: ARTHROSCOPY KNEE WITH DEBRIDEMENT AND PARTIAL MEDIAL MENISCECTOMY right knee;  Surgeon: Corky Mull, MD;  Location: [facility];  Service: Orthopedics;  Laterality: Right;  . KNEE ARTHROSCOPY Left 07/09/2017   Procedure: ARTHROSCOPY KNEE  with debridement and medial meniscectomy;  Surgeon: Corky Mull, MD;  Location: [facility];  Service: Orthopedics;  Laterality: Left;  . NOSE SURGERY    . OVARIAN CYST REMOVAL  1986    There were no vitals filed for this visit.   Subjective Assessment - 05/29/21 0907    Subjective Pt reports her PCP told her she has either fibromyalgia or lupus, not sure which. She has occassional flares from this, and has some increased back pain when this happens. Patient reports her LBP hurts only in the morning other than this. Reports 3/10 pain this am. REports she feels like she is doing better with lifting, and is working on her posture as well.    Pertinent History Pt is a 65 year old female presenting with chronic LBP. She was seen in PT before at [facility] for R sided LBP with sciatica, but had to stop d/t pandemic. Over the past 6 months she has been having increased L sided hip pain (MD attributes to bursitis), midline LBP, and pain in R buttock. Most limited by midline LBP that feels like  it is right on her spine. Reports that L and R hips bother her when it is cold outside, or sometimes she gets LBP with R buttock pain. Denies numbess/tingling sensation down LEs. Aggravating factors standing 62mins, lifting 20#, forward bending. Reports pain is better with movement and walking. Pain Current and best: 0/10; worst 6/10. Pt has had no treatment so far for back or hip pain. 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 Pt denies N/V, B&B changes, unexplained weight fluctuation, saddle paresthesia, fever, night sweats, or  unrelenting night pain at this time.    Limitations Lifting;Standing    How long can you sit comfortably? unlimited    How long can you stand comfortably? 20mins    How long can you walk comfortably? 1hour    Diagnostic tests not recently    Patient Stated Goals Being able to complete home program with her peddler    Pain Onset More than a month ago           INTERVENTION THIS DATE:  -Nustep L3 seat 5 UE 9 16mis for gentle lumbar motion and strengthening cuing for SPM >60spm  -Squat with ChairTap front loaded20# KB3X 8/7/6 with min initial cuing for technique with good carry over following  -Good mornings with PVC in back load squat position, width to comfort, forward gaze 1x15 With 20# KB 2 x6 with good carry over of posture from demo  - Monster walks with blue TB 2x 19ft with cuing needed for coordination of exercise with good carry over  -Single leg STS from very elevated mat table 2x 6 bilat with supervision for safety, difficulty with LLE>R                PT Education - 05/29/21 0915    Education Details therex form/technique    Person(s) Educated Patient    Methods Explanation;Demonstration;Verbal cues    Comprehension Verbalized understanding;Returned demonstration;Verbal cues required            PT Short Term Goals - 05/08/21 1324      PT SHORT TERM GOAL #1   Title Pt will be independent with HEP in order to improve strength and decrease back pain in order to improve pain-free function at home and work.    Baseline 05/08/21 HEP given    Time 5    Period Weeks    Status New             PT Long Term Goals - 05/08/21 1325      PT LONG TERM GOAL #1   Title Pt will decrease worst back pain as reported on NPRS by at least 2 points in order to demonstrate clinically significant reduction in back pain.    Baseline 05/08/21 6/10    Time 8    Period Weeks    Status New      PT LONG TERM GOAL #2   Title Pt will decrease 5TSTS by at least 3  seconds in order to demonstrate clinically significant improvement in LE strength    Baseline 05/08/21 13sec    Time 8    Period Weeks    Status New      PT LONG TERM GOAL #3   Title Patient will increase FOTO score to 78 to demonstrate predicted increase in functional mobility to complete ADLs    Baseline 05/08/21 39    Time 8    Period Weeks  Status New                 Plan - 05/29/21 0932    Clinical Impression Statement PT continued therex progression for increased bilat hip and core strength, with continued focus of functional lifting. Pt demonstrates much better self awareness and correction of lifting techniques, allowing for increased resistance with safety. Pt is able to comply with all cuing for proper technique of therex with good motivation throughout session. PT will continue progression as able .    Personal Factors and Comorbidities Comorbidity 1;Comorbidity 2;Comorbidity 3+;Fitness;Time since onset of injury/illness/exacerbation;Past/Current Experience    Comorbidities schizophrenia, GERD, PCOS, arthritis, chronic pain    Examination-Activity Limitations Squat;Lift;Stairs;Transfers;Stand    Examination-Participation Restrictions Cleaning    Stability/Clinical Decision Making Stable/Uncomplicated    Clinical Decision Making Moderate    Rehab Potential Good    PT Frequency 2x / week    PT Duration 8 weeks    PT Treatment/Interventions ADLs/Self Care Home Management;Electrical Stimulation;Fluidtherapy;Therapeutic activities;Patient/family education;Energy conservation;Spinal Manipulations;Joint Manipulations;Neuromuscular re-education;Stair training;Traction;Ultrasound;Functional mobility training;Therapeutic exercise;Iontophoresis 4mg /ml Dexamethasone;DME Instruction;Gait training;Balance training;Taping;Passive range of motion;Dry needling;Manual techniques;Cryotherapy;Moist Heat    PT Next Visit Plan glute and core strengthening, review lifting mechanics and posture     PT Home Exercise Plan bridge with band, squat with band, piriformis stretch    Consulted and Agree with Plan of Care Patient           Patient will benefit from skilled therapeutic intervention in order to improve the following deficits and impairments:  Improper body mechanics,Pain,Increased fascial restricitons,Abnormal gait,Decreased coordination,Decreased mobility,Postural dysfunction,Impaired tone,Decreased activity tolerance,Decreased endurance,Decreased range of motion,Decreased strength,Obesity,Impaired flexibility,Decreased balance,Decreased safety awareness,Difficulty walking  Visit Diagnosis: Chronic right-sided low back pain with right-sided sciatica     Problem List Patient Active Problem List   Diagnosis Date Noted  . Vaginal atrophy 01/18/2020  . Annual physical exam 01/15/2018   Durwin Reges DPT Durwin Reges 05/29/2021, 9:50 AM  [facility] [address] [place], [address] Phone: [phone]   Fax:  [phone]  Name: Samantha Dean MRN: [id number] Date of Birth: October 19, 1956

## 2021-06-01 ENCOUNTER — Ambulatory Visit: Payer: Medicare HMO | Attending: Physician Assistant | Admitting: Physical Therapy

## 2021-06-01 ENCOUNTER — Encounter: Payer: Self-pay | Admitting: Physical Therapy

## 2021-06-01 DIAGNOSIS — G8929 Other chronic pain: Secondary | ICD-10-CM | POA: Insufficient documentation

## 2021-06-01 DIAGNOSIS — M5441 Lumbago with sciatica, right side: Secondary | ICD-10-CM | POA: Diagnosis not present

## 2021-06-01 NOTE — Therapy (Signed)
[facility] [address] [place], [address] Phone: [phone]   Fax:  [phone]  Physical Therapy Treatment  Patient Details  Name: Samantha Dean MRN: [id number] Date of Birth: [birth year] No data recorded  Encounter Date: 06/01/2021   PT End of Session - 06/01/21 0845    Visit Number 7    Number of Visits 17    Date for PT Re-Evaluation 07/06/21    Authorization Type [employer] 16 visits    Authorization Time Period 05/08/21-07/07/21    Authorization - Visit Number 7    Authorization - Number of Visits 10    PT Start Time 0840    PT Stop Time 0918    PT Time Calculation (min) 38 min    Activity Tolerance Patient tolerated treatment well;No increased pain    Behavior During Therapy WFL for tasks assessed/performed           Past Medical History:  Diagnosis Date  . Arthritis    knee  . Depression   . Family history of pancreatic cancer    02/27/21 cancer genetic testing letter sent  . GERD (gastroesophageal reflux disease)   . Headache    thinks may have been from tick bite.  None since antibiotic  . Polycystic ovarian syndrome   . Schizophrenia ([place])   . Tick bite    approx 2 wks ago.  has finished round of doxycycline    Past Surgical History:  Procedure Laterality Date  . CESAREAN SECTION    . 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  [facility] DIAGNOSTIC LAPAROSCOPY  1987  . DILATION AND CURETTAGE OF UTERUS  approx 2004   [facility]  . ESOPHAGOGASTRODUODENOSCOPY (EGD) WITH PROPOFOL N/A 05/15/2016   Procedure: ESOPHAGOGASTRODUODENOSCOPY (EGD) WITH PROPOFOL;  Surgeon: Manya Silvas, MD;  Location: [facility] ENDOSCOPY;  Service: Endoscopy;  Laterality: N/A;  . KNEE  ARTHROSCOPY Right 07/17/2016   Procedure: ARTHROSCOPY KNEE WITH DEBRIDEMENT AND PARTIAL MEDIAL MENISCECTOMY right knee;  Surgeon: Corky Mull, MD;  Location: [place];  Service: Orthopedics;  Laterality: Right;  . KNEE ARTHROSCOPY Left 07/09/2017   Procedure: ARTHROSCOPY KNEE  with debridement and medial meniscectomy;  Surgeon: Corky Mull, MD;  Location: [place];  Service: Orthopedics;  Laterality: Left;  . NOSE SURGERY    . OVARIAN CYST REMOVAL  1986    There were no vitals filed for this visit.   Subjective Assessment - 06/01/21 0838    Subjective Pt reports reports only 2-3/10 L sided LBP, but that she lifted her canned drinks on her her L side this am. She reports she feels 75% better overall, she is no longer having sharp pains across her back and has not noticed her hip bursitis in a while. Completing HEP.    Pertinent History Pt is a 65 year old female presenting with chronic LBP. She was seen in PT before at [facility] for R sided LBP with sciatica, but had to stop d/t pandemic. Over the past 6 months she has been having increased L sided hip pain (MD attributes to bursitis), midline LBP, and pain in R buttock. Most limited by midline LBP that feels like it is right on her spine. Reports that L and R hips bother her  when it is cold outside, or sometimes she gets LBP with R buttock pain. Denies numbess/tingling sensation down LEs. Aggravating factors standing 41mins, lifting 20#, forward bending. Reports pain is better with movement and walking. Pain Current and best: 0/10; worst 6/10. Pt has had no treatment so far for back or hip pain. 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 Pt denies N/V, B&B changes, unexplained weight fluctuation, saddle paresthesia, fever, night sweats, or unrelenting night pain at this time.    Limitations Lifting;Standing    How long can you sit  comfortably? unlimited    How long can you stand comfortably? 5mins    How long can you walk comfortably? 1hour    Diagnostic tests not recently    Patient Stated Goals Being able to complete home program with her peddler    Pain Onset More than a month ago           INTERVENTION THIS DATE: -Nustep L3 seat 5 UE 9 67mis for gentle lumbar motion and strengthening cuing for SPM >60spm  5xSTS for fastest time  -Squat with row 20# 3x 8/7/6 cuing for rowing bar "close to self" and standing with bar with education on carry over into functional lifting  - Alt walking lunge 2x 12 with good carry over of initial demo/cuing; with 5# DB in each hand x12 with good carry over of technique  -Single leg STS from very elevated mat table 3x 6 bilat with supervision for safety, difficulty with LLE>R with RLE heel touch with LLE squat  R/L SLS Trial 1: 13.75sec /21sec Trial 2: sec 8.7sec/ 16;.sec            PT Education - 06/01/21 0845    Education Details therex form/technique    Person(s) Educated Patient    Methods Explanation;Demonstration;Verbal cues    Comprehension Verbalized understanding;Returned demonstration;Verbal cues required            PT Short Term Goals - 05/08/21 1324      PT SHORT TERM GOAL #1   Title Pt will be independent with HEP in order to improve strength and decrease back pain in order to improve pain-free function at home and work.    Baseline 05/08/21 HEP given    Time 5    Period Weeks    Status New             PT Long Term Goals - 06/01/21 0847      PT LONG TERM GOAL #1   Title Pt will decrease worst back pain as reported on NPRS by at least 2 points in order to demonstrate clinically significant reduction in back pain.    Baseline 05/08/21 6/10; 06/01/21 3/10 pain, no sharp pains    Time 8    Period Weeks    Status Achieved      PT LONG TERM GOAL #2   Title Pt will decrease 5TSTS by at least 3 seconds in order to demonstrate clinically  significant improvement in LE strength    Baseline 05/08/21 13sec; 06/01/21 10sec    Time 8    Period Weeks    Status Achieved      PT LONG TERM GOAL #3   Title Patient will increase FOTO score to 78 to demonstrate predicted increase in functional mobility to complete ADLs    Baseline 05/08/21 39; 06/01/21 70    Time 8    Period Weeks  PT LONG TERM GOAL #4   Title Pt will demonstrate SLS of RLE to at least 19sec to demonstrate symmetry of LLE static balance for return to PLOF    Baseline 06/01/21 13.75sec    Time 8    Period Weeks    Status New                 Plan - 06/01/21 0915    Clinical Impression Statement PT continued therex progression for increased bilat hip and core strength and stability, with carry over into functional lifting/movements with success. Patient is able to comply with multimodal cuing for proper technique of all therex. Patient is progressing well through therapy, and PT and pt agree that she is able to complete PT at frequency of 1x/week for her remaining sessions to increase independence toward d/c. Pt is motivated throughout session, without increase pain. Does demonstrate difficulty with balance in SLS positions, encouraged to work on this in HEP as well with understanding. PT will continue progression as able.    Personal Factors and Comorbidities Comorbidity 1;Comorbidity 2;Comorbidity 3+;Fitness;Time since onset of injury/illness/exacerbation;Past/Current Experience    Comorbidities schizophrenia, GERD, PCOS, arthritis, chronic pain    Examination-Activity Limitations Squat;Lift;Stairs;Transfers;Stand    Examination-Participation Restrictions Cleaning    Stability/Clinical Decision Making Stable/Uncomplicated    Clinical Decision Making Moderate    Rehab Potential Good    PT Frequency 2x / week    PT Duration 8 weeks    PT Treatment/Interventions ADLs/Self Care Home Management;Electrical Stimulation;Fluidtherapy;Therapeutic activities;Patient/family  education;Energy conservation;Spinal Manipulations;Joint Manipulations;Neuromuscular re-education;Stair training;Traction;Ultrasound;Functional mobility training;Therapeutic exercise;Iontophoresis 4mg /ml Dexamethasone;DME Instruction;Gait training;Balance training;Taping;Passive range of motion;Dry needling;Manual techniques;Cryotherapy;Moist Heat    PT Next Visit Plan glute and core strengthening, review lifting mechanics and posture    PT Home Exercise Plan bridge with band, squat with band, piriformis stretch    Consulted and Agree with Plan of Care Patient           Patient will benefit from skilled therapeutic intervention in order to improve the following deficits and impairments:  Improper body mechanics,Pain,Increased fascial restricitons,Abnormal gait,Decreased coordination,Decreased mobility,Postural dysfunction,Impaired tone,Decreased activity tolerance,Decreased endurance,Decreased range of motion,Decreased strength,Obesity,Impaired flexibility,Decreased balance,Decreased safety awareness,Difficulty walking  Visit Diagnosis: Chronic right-sided low back pain with right-sided sciatica     Problem List Patient Active Problem List   Diagnosis Date Noted  . Vaginal atrophy 01/18/2020  . Annual physical exam 01/15/2018   Durwin Reges DPT Durwin Reges 06/01/2021, 9:22 AM  [facility] [address] [place], [address] Phone: [phone]   Fax:  [phone]  Name: Samantha Dean MRN: [id number] Date of Birth: 11-23-1956

## 2021-06-05 ENCOUNTER — Ambulatory Visit: Payer: Medicare HMO | Admitting: Physical Therapy

## 2021-06-06 ENCOUNTER — Ambulatory Visit: Payer: Medicare HMO | Admitting: Dermatology

## 2021-06-07 ENCOUNTER — Ambulatory Visit: Payer: Medicare HMO | Admitting: Physical Therapy

## 2021-06-07 ENCOUNTER — Encounter: Payer: Self-pay | Admitting: Physical Therapy

## 2021-06-07 ENCOUNTER — Other Ambulatory Visit: Payer: Self-pay

## 2021-06-07 DIAGNOSIS — M5441 Lumbago with sciatica, right side: Secondary | ICD-10-CM

## 2021-06-07 DIAGNOSIS — G8929 Other chronic pain: Secondary | ICD-10-CM | POA: Diagnosis not present

## 2021-06-07 NOTE — Therapy (Signed)
[facility] [address] [place], [address] Phone: [phone]   Fax:  [phone]  Physical Therapy Treatment  Patient Details  Name: Samantha Dean MRN: [id number] Date of Birth: Feb 03, 1956 No data recorded  Encounter Date: 06/07/2021   PT End of Session - 06/07/21 0909     Visit Number 8    Number of Visits 17    Date for PT Re-Evaluation 07/06/21    Authorization Type [employer] H[employer] 16 visits    Authorization Time Period 05/08/21-07/07/21    Authorization - Visit Number 8    Authorization - Number of Visits 10    PT Start Time 0902    PT Stop Time 0940    PT Time Calculation (min) 38 min    Activity Tolerance Patient tolerated treatment well;No increased pain    Behavior During Therapy [facility] for tasks assessed/performed             Past Medical History:  Diagnosis Date   Arthritis    knee   Depression    Family history of pancreatic cancer    02/27/21 cancer genetic testing letter sent   GERD (gastroesophageal reflux disease)    Headache    thinks may have been from tick bite.  None since antibiotic   Polycystic ovarian syndrome    Schizophrenia ([place])    Tick bite    approx 2 wks ago.  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   DIAGNOSTIC LAPAROSCOPY  1987   DILATION AND CURETTAGE OF UTERUS  approx 2004   [place]   ESOPHAGOGASTRODUODENOSCOPY (EGD) WITH PROPOFOL N/A 05/15/2016   Procedure: ESOPHAGOGASTRODUODENOSCOPY (EGD) WITH PROPOFOL;  Surgeon: Manya Silvas, MD;  Location: [facility]COPY;  Service: Endoscopy;  Laterality: N/A;   KNEE  ARTHROSCOPY Right 07/17/2016   Procedure: ARTHROSCOPY KNEE WITH DEBRIDEMENT AND PARTIAL MEDIAL MENISCECTOMY right knee;  Surgeon: Corky Mull, MD;  Location: [place];  Service: Orthopedics;  Laterality: Right;   KNEE ARTHROSCOPY Left 07/09/2017   Procedure: ARTHROSCOPY KNEE  with debridement and medial meniscectomy;  Surgeon: Corky Mull, MD;  Location: [place];  Service: Orthopedics;  Laterality: Left;   NOSE SURGERY     OVARIAN CYST REMOVAL  1986    There were no vitals filed for this visit.   Subjective Assessment - 06/07/21 0905     Subjective Patient reports for the past few days she has been having a fibromyalgia flare up 4 days prior to today; that she felt dull pain all over and fatgieud. Shewoke up today feeling better. Is having 2/10 L sided LBP, this is more sore than pain. Hip pain is still completely gone. Has not had time to complete HEP, but has been employing safe lifting mechanics in ADLs.    Pertinent History Pt is a 65 year old female presenting with chronic LBP. She was seen in PT before at [facility] for R sided LBP with sciatica, but had to stop d/t pandemic. Over the past 6 months she has been having increased L sided hip pain (MD attributes to bursitis), midline LBP, and pain in R buttock. Most  limited by midline LBP that feels like it is right on her spine. Reports that L and R hips bother her when it is cold outside, or sometimes she gets LBP with R buttock pain. Denies numbess/tingling sensation down LEs. Aggravating factors standing 57mins, lifting 20#, forward bending. Reports pain is better with movement and walking. Pain Current and best: 0/10; worst 6/10. Pt has had no treatment so far for back or hip pain. 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 Pt denies N/V, B&B changes, unexplained weight fluctuation, saddle paresthesia, fever, night sweats, or  unrelenting night pain at this time.    Limitations Lifting;Standing    How long can you stand comfortably? 39mins    How long can you walk comfortably? 1hour    Diagnostic tests not recently    Patient Stated Goals Being able to complete home program with her peddler    Pain Onset More than a month ago                INTERVENTION THIS DATE:  -Nustep L3 seat 5 UE 9 16mis for gentle lumbar motion and strengthening cuing for SPM >60spm   -Squat with KB swing 20# KB 3X 6 with cuing initially for sequencing with good carry over  Abd slider in mini squat position 2x 10 bilat with good carry over of demo and initial cuing to "stay low"   Wt'd ball toss/catch at rebounder tandem x10 each more difficulty with L tandem; in SLS x10 each LE with toe down for catch nearly every rep supervision for safety; 3kg ball   Alt reverse lunge 2x 12 without UE support, clear difficulty with balance supervision for safety    -Single leg STS from very elevated mat table 2x 6 bilat with supervision for safety, difficulty with LLE>R, difficulty with balance                       PT Education - 06/07/21 0909     Education Details therex form/technique    Person(s) Educated Patient    Methods Explanation;Demonstration;Verbal cues    Comprehension Verbalized understanding;Returned demonstration;Verbal cues required              PT Short Term Goals - 05/08/21 1324       PT SHORT TERM GOAL #1   Title Pt will be independent with HEP in order to improve strength and decrease back pain in order to improve pain-free function at home and work.    Baseline 05/08/21 HEP given    Time 5    Period Weeks    Status New               PT Long Term Goals - 06/01/21 0847       PT LONG TERM GOAL #1   Title Pt will decrease worst back pain as reported on NPRS by at least 2 points in order to demonstrate clinically significant reduction in back pain.    Baseline 05/08/21 6/10; 06/01/21  3/10 pain, no sharp pains    Time 8    Period Weeks    Status Achieved      PT LONG TERM GOAL #2   Title Pt will decrease 5TSTS by at least 3 seconds in order to demonstrate clinically significant improvement in LE strength    Baseline 05/08/21 13sec; 06/01/21 10sec    Time 8    Period Weeks  Status Achieved      PT LONG TERM GOAL #3   Title Patient will increase FOTO score to 78 to demonstrate predicted increase in functional mobility to complete ADLs    Baseline 05/08/21 39; 06/01/21 70    Time 8    Period Weeks      PT LONG TERM GOAL #4   Title Pt will demonstrate SLS of RLE to at least 19sec to demonstrate symmetry of LLE static balance for return to PLOF    Baseline 06/01/21 13.75sec    Time 8    Period Weeks    Status New                   Plan - 06/07/21 0911     Clinical Impression Statement PT continued therex progression for increased bilat hip and core strength, with increased static balance demand with success. Patient is able to comply with all multimodal cuing for proper technique of therex with good motivation and no increased pain throughout session. PT will continue therex progression as able.    Personal Factors and Comorbidities Comorbidity 1;Comorbidity 2;Comorbidity 3+;Fitness;Time since onset of injury/illness/exacerbation;Past/Current Experience    Comorbidities schizophrenia, GERD, PCOS, arthritis, chronic pain    Examination-Activity Limitations Squat;Lift;Stairs;Transfers;Stand    Examination-Participation Restrictions Cleaning    Stability/Clinical Decision Making Stable/Uncomplicated    Clinical Decision Making Moderate    Rehab Potential Good    PT Frequency 2x / week    PT Duration 8 weeks    PT Treatment/Interventions ADLs/Self Care Home Management;Electrical Stimulation;Fluidtherapy;Therapeutic activities;Patient/family education;Energy conservation;Spinal Manipulations;Joint Manipulations;Neuromuscular re-education;Stair  training;Traction;Ultrasound;Functional mobility training;Therapeutic exercise;Iontophoresis 4mg /ml Dexamethasone;DME Instruction;Gait training;Balance training;Taping;Passive range of motion;Dry needling;Manual techniques;Cryotherapy;Moist Heat    PT Next Visit Plan glute and core strengthening, review lifting mechanics and posture    PT Home Exercise Plan bridge with band, squat with band, piriformis stretch    Consulted and Agree with Plan of Care Patient             Patient will benefit from skilled therapeutic intervention in order to improve the following deficits and impairments:  Improper body mechanics, Pain, Increased fascial restricitons, Abnormal gait, Decreased coordination, Decreased mobility, Postural dysfunction, Impaired tone, Decreased activity tolerance, Decreased endurance, Decreased range of motion, Decreased strength, Obesity, Impaired flexibility, Decreased balance, Decreased safety awareness, Difficulty walking  Visit Diagnosis: Chronic right-sided low back pain with right-sided sciatica     Problem List Patient Active Problem List   Diagnosis Date Noted   Vaginal atrophy 01/18/2020   Annual physical exam 01/15/2018   Durwin Reges DPT Durwin Reges 06/07/2021, 9:55 AM  [facility] [address], [place], [address] Phone: [phone]   Fax:  [phone]  Name: Samantha Dean MRN: [id number] Date of Birth: [birth year]

## 2021-06-13 ENCOUNTER — Ambulatory Visit: Payer: Medicare HMO | Admitting: Physical Therapy

## 2021-06-13 ENCOUNTER — Encounter: Payer: Self-pay | Admitting: Physical Therapy

## 2021-06-13 ENCOUNTER — Other Ambulatory Visit: Payer: Self-pay

## 2021-06-13 DIAGNOSIS — M5441 Lumbago with sciatica, right side: Secondary | ICD-10-CM | POA: Diagnosis not present

## 2021-06-13 DIAGNOSIS — G8929 Other chronic pain: Secondary | ICD-10-CM | POA: Diagnosis not present

## 2021-06-13 NOTE — Therapy (Signed)
[place] [facility] [address] [place], [address] Phone: [phone]   Fax:  [phone]  Physical Therapy Treatment  Patient Details  Name: Samantha Dean MRN: [id number] Date of Birth: 1956/07/09 No data recorded  Encounter Date: 06/13/2021   PT End of Session - 06/13/21 1025     Visit Number 9    Number of Visits 17    Date for PT Re-Evaluation 07/06/21    Authorization Type [employer] 16 visits    Authorization Time Period 05/08/21-07/07/21    Authorization - Visit Number 9    Authorization - Number of Visits 10    PT Start Time 0947    PT Stop Time 1026    PT Time Calculation (min) 39 min    Activity Tolerance Patient tolerated treatment well;No increased pain    Behavior During Therapy [facility] for tasks assessed/performed             Past Medical History:  Diagnosis Date   Arthritis    knee   Depression    Family history of pancreatic cancer    02/27/21 cancer genetic testing letter sent   GERD (gastroesophageal reflux disease)    Headache    thinks may have been from tick bite.  None since antibiotic   Polycystic ovarian syndrome    Schizophrenia ([place])    Tick bite    approx 2 wks ago.  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   DIAGNOSTIC LAPAROSCOPY  1987   DILATION AND CURETTAGE OF UTERUS  approx 2004   [place]   ESOPHAGOGASTRODUODENOSCOPY (EGD) WITH PROPOFOL N/A 05/15/2016   Procedure: ESOPHAGOGASTRODUODENOSCOPY (EGD) WITH PROPOFOL;  Surgeon: Manya Silvas, MD;  [facility] END[facility];  Service: Endoscopy;  Laterality: N/A;   KNEE  ARTHROSCOPY Right 07/17/2016   Procedure: ARTHROSCOPY KNEE WITH DEBRIDEMENT AND PARTIAL MEDIAL MENISCECTOMY right knee;  Surgeon: Corky Mull, MD;  Location: [place];  Service: Orthopedics;  Laterality: Right;   KNEE ARTHROSCOPY Left 07/09/2017   Procedure: ARTHROSCOPY KNEE  with debridement and medial meniscectomy;  Surgeon: Corky Mull, MD;  Location: [place];  Service: Orthopedics;  Laterality: Left;   NOSE SURGERY     OVARIAN CYST REMOVAL  1986    There were no vitals filed for this visit.   Subjective Assessment - 06/13/21 0950     Subjective Pt reports to therapy with back pain 1/10 on R sided LBP with LE stiffness. Pt reports working 10 days straight and has limited time to rest. She reports likely having a Fibromylagia flare up. She is limited activity in HEP do to work and caregiving role.    Pertinent History Pt is a 65 year old female presenting with chronic LBP. She was seen in PT before at [facility] for R sided LBP with sciatica, but had to stop d/t pandemic. Over the past 6 months she has been having increased L sided hip pain (MD attributes to bursitis), midline LBP, and pain in R buttock. Most limited by midline LBP that feels like it is right on her spine. Reports that L and R hips bother her  when it is cold outside, or sometimes she gets LBP with R buttock pain. Denies numbess/tingling sensation down LEs. Aggravating factors standing 34mins, lifting 20#, forward bending. Reports pain is better with movement and walking. Pain Current and best: 0/10; worst 6/10. Pt has had no treatment so far for back or hip pain. 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 Pt denies N/V, B&B changes, unexplained weight fluctuation, saddle paresthesia, fever, night sweats, or unrelenting night pain at this time.    Limitations Lifting;Standing              Therapeutic  Exercise   Nu step level 3 x 5 min UE9 Seat 5  Trx Squat 2 x 15 with cueing to place buttock back and torso forward  Single Leg STS 2 x 10 bilat cueing to control decent handle held assist for balance  Theraball exerciise alternating opposite UE/LE  raises 2 x 10 reps cues to hold 3 sec  Modified bird dog: 1 x 8 reps  Glute bridge on bosu ball: 1 x10 reps                        PT Education - 06/13/21 1029     Education Details therex form/technique    Person(s) Educated Patient    Methods Explanation    Comprehension Verbalized understanding;Returned demonstration              PT Short Term Goals - 05/08/21 1324       PT SHORT TERM GOAL #1   Title Pt will be independent with HEP in order to improve strength and decrease back pain in order to improve pain-free function at home and work.    Baseline 05/08/21 HEP given    Time 5    Period Weeks    Status New               PT Long Term Goals - 06/01/21 0847       PT LONG TERM GOAL #1   Title Pt will decrease worst back pain as reported on NPRS by at least 2 points in order to demonstrate clinically significant reduction in back pain.    Baseline 05/08/21 6/10; 06/01/21 3/10 pain, no sharp pains    Time 8    Period Weeks    Status Achieved      PT LONG TERM GOAL #2   Title Pt will decrease 5TSTS by at least 3 seconds in order to demonstrate clinically significant improvement in LE strength    Baseline 05/08/21 13sec; 06/01/21 10sec    Time 8    Period Weeks    Status Achieved      PT LONG TERM GOAL #3   Title Patient will increase FOTO score to 78 to demonstrate predicted increase in functional mobility to complete ADLs    Baseline 05/08/21 39; 06/01/21 70    Time 8    Period Weeks      PT LONG TERM GOAL #4   Title Pt will demonstrate SLS of RLE to at least 19sec to demonstrate symmetry of LLE static balance for return to PLOF    Baseline 06/01/21 13.75sec    Time 8    Period Weeks     Status New                   Plan - 06/13/21 1030  Clinical Impression Statement Patient tolerated session well evidenced decrease in pain to 0/10 and reports of decreased stiffness. PT focused on increased LE strength and cores stability  to address impairments and achieve set goals. Pt will continue to benefit skilled physical therapy to ensure proper form and safe exercise progression.    Personal Factors and Comorbidities Comorbidity 1;Comorbidity 2;Comorbidity 3+;Fitness;Time since onset of injury/illness/exacerbation;Past/Current Experience    Comorbidities schizophrenia, GERD, PCOS, arthritis, chronic pain    Examination-Activity Limitations Squat;Lift;Stairs;Transfers;Stand    PT Treatment/Interventions ADLs/Self Care Home Management;Electrical Stimulation;Fluidtherapy;Therapeutic activities;Patient/family education;Energy conservation;Spinal Manipulations;Joint Manipulations;Neuromuscular re-education;Stair training;Traction;Ultrasound;Functional mobility training;Therapeutic exercise;Iontophoresis 4mg /ml Dexamethasone;DME Instruction;Gait training;Balance training;Taping;Passive range of motion;Dry needling;Manual techniques;Cryotherapy;Moist Heat    PT Home Exercise Plan bridge with band, squat with band, piriformis stretch    Consulted and Agree with Plan of Care Patient             Patient will benefit from skilled therapeutic intervention in order to improve the following deficits and impairments:  Improper body mechanics, Pain, Increased fascial restricitons, Abnormal gait, Decreased coordination, Decreased mobility, Postural dysfunction, Impaired tone, Decreased activity tolerance, Decreased endurance, Decreased range of motion, Decreased strength, Obesity, Impaired flexibility, Decreased balance, Decreased safety awareness, Difficulty walking  Visit Diagnosis: Chronic right-sided low back pain with right-sided sciatica     Problem List Patient Active Problem  List   Diagnosis Date Noted   Vaginal atrophy 01/18/2020   Annual physical exam 01/15/2018    Durwin Reges DPT  Sharion Settler, [place] 06/14/2021, 8:48 AM  [place] [facility] [address], [place], [address] Phone: [phone]   Fax:  [phone]  Name: MARCELL PFEIFER MRN: [id number] Date of Birth: 1956-04-15

## 2021-06-15 DIAGNOSIS — H2513 Age-related nuclear cataract, bilateral: Secondary | ICD-10-CM | POA: Diagnosis not present

## 2021-06-15 DIAGNOSIS — Z0100 Encounter for examination of eyes and vision without abnormal findings: Secondary | ICD-10-CM | POA: Diagnosis not present

## 2021-06-19 ENCOUNTER — Encounter: Payer: Self-pay | Admitting: Physical Therapy

## 2021-06-19 ENCOUNTER — Ambulatory Visit: Payer: Medicare HMO | Admitting: Physical Therapy

## 2021-06-19 ENCOUNTER — Other Ambulatory Visit: Payer: Self-pay

## 2021-06-19 DIAGNOSIS — G8929 Other chronic pain: Secondary | ICD-10-CM | POA: Diagnosis not present

## 2021-06-19 DIAGNOSIS — M5441 Lumbago with sciatica, right side: Secondary | ICD-10-CM | POA: Diagnosis not present

## 2021-06-19 NOTE — Therapy (Signed)
[place] [facility] [address] [place], [address] Phone: [phone]   Fax:  [phone]  Physical Therapy Treatment/DC Summary  Reporting Period 05/08/21- 06/19/21  Patient Details  Name: Samantha Dean MRN: [id number] Date of Birth: [birth year] No data recorded  Encounter Date: 06/19/2021   PT End of Session - 06/19/21 0930     Visit Number 10    Number of Visits 17    Date for PT Re-Evaluation 07/06/21    Authorization Type [employer] H[employer] 16 visits    Authorization Time Period 05/08/21-07/07/21    Authorization - Visit Number 10    Authorization - Number of Visits 10    PT Start Time 0900    PT Stop Time 0927    PT Time Calculation (min) 27 min    Activity Tolerance Patient tolerated treatment well;No increased pain    Behavior During Therapy [facility] for tasks assessed/performed             Past Medical History:  Diagnosis Date   Arthritis    knee   Depression    Family history of pancreatic cancer    02/27/21 cancer genetic testing letter sent   GERD (gastroesophageal reflux disease)    Headache    thinks may have been from tick bite.  None since antibiotic   Polycystic ovarian syndrome    Schizophrenia ([place])    Tick bite    approx 2 wks ago.  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   DIAGNOSTIC LAPAROSCOPY  1987   DILATION AND CURETTAGE OF UTERUS  approx 2004   [place]   ESOPHAGOGASTRODUODENOSCOPY (EGD) WITH PROPOFOL N/A 05/15/2016   Procedure: ESOPHAGOGASTRODUODENOSCOPY (EGD) WITH PROPOFOL;  Surgeon: Manya Silvas, MD;  Location: [facility] ENDOSCOPY;   Service: Endoscopy;  Laterality: N/A;   KNEE ARTHROSCOPY Right 07/17/2016   Procedure: ARTHROSCOPY KNEE WITH DEBRIDEMENT AND PARTIAL MEDIAL MENISCECTOMY right knee;  Surgeon: Corky Mull, MD;  Location: [place];  Service: Orthopedics;  Laterality: Right;   KNEE ARTHROSCOPY Left 07/09/2017   Procedure: ARTHROSCOPY KNEE  with debridement and medial meniscectomy;  Surgeon: Corky Mull, MD;  Location: [place];  Service: Orthopedics;  Laterality: Left;   NOSE SURGERY     OVARIAN CYST REMOVAL  1986    There were no vitals filed for this visit.   Subjective Assessment - 06/19/21 0911     Subjective Pt reports some bilat knee pain over the weekend, feels "sore", no LBP today. She is limiting her HEP to help her parents but is trying to complete.    Pertinent History Pt is a 65 year old female presenting with chronic LBP. She was seen in PT before at [facility] for R sided LBP with sciatica, but had to stop d/t pandemic. Over the past 6 months she has been having increased L sided hip pain (MD attributes to bursitis), midline LBP, and pain in R buttock. Most limited by midline LBP that feels like it is right on her spine. Reports that L and R hips bother her when it is cold outside, or sometimes she gets LBP with R buttock  pain. Denies numbess/tingling sensation down LEs. Aggravating factors standing 21mns, lifting 20#, forward bending. Reports pain is better with movement and walking. Pain Current and best: 0/10; worst 6/10. Pt has had no treatment so far for back or hip pain. 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 Pt denies N/V, B&B changes, unexplained weight fluctuation, saddle paresthesia, fever, night sweats, or unrelenting night pain at this time.    Limitations Lifting;Standing    How long can you sit comfortably? unlimited    How long can you stand comfortably? 498ms    How  long can you walk comfortably? 1hour    Diagnostic tests not recently    Patient Stated Goals Being able to complete home program with her peddler    Pain Onset More than a month ago              Therapeutic Exercise     Nu step level 3 x 5 min UE9 Seat 5   PT reviewed the following HEP with patient with patient able to demonstrate a set of the following with min cuing for correction needed. PT educated patient on parameters of therex (how/when to inc/decrease intensity, frequency, rep/set range, stretch hold time, and purpose of therex) with verbalized understanding.   Access Code: [email or web address] Squat - 1 x daily - 2-3 x weekly - 3 sets - 6-10 reps Half Deadlift with Kettlebell - 1 x daily - 2-3 x weekly - 3 sets - 6-10 reps Single Leg Sit to Stand with Arms Extended - 1 x daily - 2-3 x weekly - 3 sets - 6-10 reps Side Stepping with Resistance at Ankles - 1 x daily - 2-3 x weekly - 3 sets - 6-10 reps Standing Single Leg Stance with Counter Support - 1-2 x daily - 7 x weekly - 20sec hold                              PT Education - 06/19/21 0929     Education Details therex form/technique    Person(s) Educated Patient    Methods Explanation;Demonstration;Verbal cues    Comprehension Verbalized understanding;Returned demonstration;Verbal cues required              PT Short Term Goals - 05/08/21 1324       PT SHORT TERM GOAL #1   Title Pt will be independent with HEP in order to improve strength and decrease back pain in order to improve pain-free function at home and work.    Baseline 05/08/21 HEP given    Time 5    Period Weeks    Status New               PT Long Term Goals - 06/19/21 0912       PT LONG TERM GOAL #3   Title Patient will increase FOTO score to 78 to demonstrate predicted increase in functional mobility to complete ADLs    Baseline 05/08/21 39; 06/01/21 70; 06/19/21 87    Time 8    Period Weeks    Status Achieved       PT LONG TERM GOAL #4   Title Pt will demonstrate SLS of RLE to at least 19sec to demonstrate symmetry of LLE static balance for return to PLOF    Baseline 06/01/21 13.75sec; 06/19/21 25sec    Time 8    Period  Weeks    Status Not Met                   Plan - 06/19/21 0930     Clinical Impression Statement PT reviewed patient goals this session where patient has meet all goals to safely d/c PT. PT reviewed robust HEP with patient able to demonstrate and verbalize understanding of therex technique and all parameters of therex for maintained strengthening and proper lifitng techniques. Pt to d/c PT.    Personal Factors and Comorbidities Comorbidity 1;Comorbidity 2;Comorbidity 3+;Fitness;Time since onset of injury/illness/exacerbation;Past/Current Experience    Comorbidities schizophrenia, GERD, PCOS, arthritis, chronic pain    Examination-Activity Limitations Squat;Lift;Stairs;Transfers;Stand    Examination-Participation Restrictions Cleaning    Stability/Clinical Decision Making Stable/Uncomplicated    Clinical Decision Making Moderate    Rehab Potential Good    PT Frequency 2x / week    PT Duration 8 weeks    PT Treatment/Interventions ADLs/Self Care Home Management;Electrical Stimulation;Fluidtherapy;Therapeutic activities;Patient/family education;Energy conservation;Spinal Manipulations;Joint Manipulations;Neuromuscular re-education;Stair training;Traction;Ultrasound;Functional mobility training;Therapeutic exercise;Iontophoresis 11m/ml Dexamethasone;DME Instruction;Gait training;Balance training;Taping;Passive range of motion;Dry needling;Manual techniques;Cryotherapy;Moist Heat    PT Next Visit Plan glute and core strengthening, review lifting mechanics and posture    PT Home Exercise Plan bridge with band, squat with band, piriformis stretch    Consulted and Agree with Plan of Care Patient             Patient will benefit from skilled therapeutic intervention in order  to improve the following deficits and impairments:  Improper body mechanics, Pain, Increased fascial restricitons, Abnormal gait, Decreased coordination, Decreased mobility, Postural dysfunction, Impaired tone, Decreased activity tolerance, Decreased endurance, Decreased range of motion, Decreased strength, Obesity, Impaired flexibility, Decreased balance, Decreased safety awareness, Difficulty walking  Visit Diagnosis: Chronic right-sided low back pain with right-sided sciatica     Problem List Patient Active Problem List   Diagnosis Date Noted   Vaginal atrophy 01/18/2020   Annual physical exam 01/15/2018   CDurwin RegesDPT CDurwin Reges6/21/2022, 9:36 AM  [facility] [address] [place] [address]Phone: [phone]  Fax:  [phone] Name: SBUFFEY ZABINSKIMRN: [id number] of Birth: [birth year]

## 2021-07-04 DIAGNOSIS — U071 COVID-19: Secondary | ICD-10-CM | POA: Diagnosis not present

## 2021-07-04 DIAGNOSIS — Z20822 Contact with and (suspected) exposure to covid-19: Secondary | ICD-10-CM | POA: Diagnosis not present

## 2021-07-10 DIAGNOSIS — F209 Schizophrenia, unspecified: Secondary | ICD-10-CM | POA: Diagnosis not present

## 2021-07-19 DIAGNOSIS — R053 Chronic cough: Secondary | ICD-10-CM | POA: Diagnosis not present

## 2021-07-19 DIAGNOSIS — U071 COVID-19: Secondary | ICD-10-CM | POA: Diagnosis not present

## 2021-07-19 DIAGNOSIS — R059 Cough, unspecified: Secondary | ICD-10-CM | POA: Diagnosis not present

## 2021-08-07 ENCOUNTER — Telehealth: Payer: Self-pay

## 2021-08-07 NOTE — Telephone Encounter (Signed)
LVM for patient to call office to change appt for [facility] from 8/17 at 10am to 8/16 at 12:20pm./js

## 2021-08-10 DIAGNOSIS — J069 Acute upper respiratory infection, unspecified: Secondary | ICD-10-CM | POA: Diagnosis not present

## 2021-08-15 ENCOUNTER — Ambulatory Visit: Payer: Medicare HMO | Admitting: Dermatology

## 2021-08-20 DIAGNOSIS — R300 Dysuria: Secondary | ICD-10-CM | POA: Diagnosis not present

## 2021-08-20 DIAGNOSIS — R10813 Right lower quadrant abdominal tenderness: Secondary | ICD-10-CM | POA: Diagnosis not present

## 2021-08-23 ENCOUNTER — Other Ambulatory Visit (HOSPITAL_BASED_OUTPATIENT_CLINIC_OR_DEPARTMENT_OTHER): Payer: Self-pay | Admitting: Internal Medicine

## 2021-08-23 ENCOUNTER — Other Ambulatory Visit: Payer: Self-pay | Admitting: Internal Medicine

## 2021-08-23 DIAGNOSIS — R10813 Right lower quadrant abdominal tenderness: Secondary | ICD-10-CM

## 2021-09-04 ENCOUNTER — Ambulatory Visit: Admission: RE | Admit: 2021-09-04 | Payer: Medicare HMO | Source: Ambulatory Visit

## 2021-09-04 ENCOUNTER — Other Ambulatory Visit: Payer: Self-pay

## 2021-09-04 ENCOUNTER — Ambulatory Visit
Admission: RE | Admit: 2021-09-04 | Discharge: 2021-09-04 | Disposition: A | Discharge status: Home / Self Care | Payer: Medicare HMO | Source: Ambulatory Visit | Attending: Internal Medicine | Admitting: Internal Medicine

## 2021-09-04 DIAGNOSIS — R1031 Right lower quadrant pain: Secondary | ICD-10-CM | POA: Diagnosis not present

## 2021-09-04 DIAGNOSIS — R10813 Right lower quadrant abdominal tenderness: Secondary | ICD-10-CM | POA: Insufficient documentation

## 2021-09-04 LAB — POCT I-STAT CREATININE: Creatinine, Ser: 0.7 mg/dL (ref 0.44–1.00)

## 2021-09-04 MED ORDER — IOHEXOL 350 MG/ML SOLN
100.0000 mL | Freq: Once | INTRAVENOUS | Status: AC | PRN
  Administered 2021-09-04: 100 mL via INTRAVENOUS

## 2021-09-12 DIAGNOSIS — R531 Weakness: Secondary | ICD-10-CM | POA: Diagnosis not present

## 2021-09-12 DIAGNOSIS — W57XXXA Bitten or stung by nonvenomous insect and other nonvenomous arthropods, initial encounter: Secondary | ICD-10-CM | POA: Diagnosis not present

## 2021-09-12 DIAGNOSIS — S1096XA Insect bite of unspecified part of neck, initial encounter: Secondary | ICD-10-CM | POA: Diagnosis not present

## 2021-10-05 DIAGNOSIS — F209 Schizophrenia, unspecified: Secondary | ICD-10-CM | POA: Diagnosis not present

## 2021-10-10 DIAGNOSIS — Z23 Encounter for immunization: Secondary | ICD-10-CM | POA: Diagnosis not present

## 2021-10-29 DIAGNOSIS — R058 Other specified cough: Secondary | ICD-10-CM | POA: Diagnosis not present

## 2021-10-29 DIAGNOSIS — J019 Acute sinusitis, unspecified: Secondary | ICD-10-CM | POA: Diagnosis not present

## 2021-10-29 DIAGNOSIS — J3489 Other specified disorders of nose and nasal sinuses: Secondary | ICD-10-CM | POA: Diagnosis not present

## 2021-10-29 DIAGNOSIS — R0981 Nasal congestion: Secondary | ICD-10-CM | POA: Diagnosis not present

## 2021-11-19 DIAGNOSIS — E282 Polycystic ovarian syndrome: Secondary | ICD-10-CM | POA: Diagnosis not present

## 2021-11-19 DIAGNOSIS — E785 Hyperlipidemia, unspecified: Secondary | ICD-10-CM | POA: Diagnosis not present

## 2021-11-26 DIAGNOSIS — Z6841 Body Mass Index (BMI) 40.0 and over, adult: Secondary | ICD-10-CM | POA: Diagnosis not present

## 2021-11-26 DIAGNOSIS — E282 Polycystic ovarian syndrome: Secondary | ICD-10-CM | POA: Diagnosis not present

## 2021-11-26 DIAGNOSIS — E785 Hyperlipidemia, unspecified: Secondary | ICD-10-CM | POA: Diagnosis not present

## 2021-11-26 DIAGNOSIS — E669 Obesity, unspecified: Secondary | ICD-10-CM | POA: Diagnosis not present

## 2021-11-26 DIAGNOSIS — Z0001 Encounter for general adult medical examination with abnormal findings: Secondary | ICD-10-CM | POA: Diagnosis not present

## 2021-11-26 DIAGNOSIS — F250 Schizoaffective disorder, bipolar type: Secondary | ICD-10-CM | POA: Diagnosis not present

## 2021-12-05 ENCOUNTER — Other Ambulatory Visit: Payer: Self-pay | Admitting: Internal Medicine

## 2021-12-05 DIAGNOSIS — Z1231 Encounter for screening mammogram for malignant neoplasm of breast: Secondary | ICD-10-CM

## 2021-12-06 ENCOUNTER — Other Ambulatory Visit: Payer: Self-pay

## 2021-12-06 ENCOUNTER — Ambulatory Visit: Payer: Medicare HMO | Admitting: Dermatology

## 2021-12-06 DIAGNOSIS — L700 Acne vulgaris: Secondary | ICD-10-CM

## 2021-12-06 DIAGNOSIS — L578 Other skin changes due to chronic exposure to nonionizing radiation: Secondary | ICD-10-CM | POA: Diagnosis not present

## 2021-12-06 DIAGNOSIS — L304 Erythema intertrigo: Secondary | ICD-10-CM

## 2021-12-06 DIAGNOSIS — D229 Melanocytic nevi, unspecified: Secondary | ICD-10-CM

## 2021-12-06 DIAGNOSIS — L821 Other seborrheic keratosis: Secondary | ICD-10-CM

## 2021-12-06 DIAGNOSIS — L814 Other melanin hyperpigmentation: Secondary | ICD-10-CM

## 2021-12-06 DIAGNOSIS — D2362 Other benign neoplasm of skin of left upper limb, including shoulder: Secondary | ICD-10-CM

## 2021-12-06 DIAGNOSIS — L719 Rosacea, unspecified: Secondary | ICD-10-CM

## 2021-12-06 DIAGNOSIS — Z1283 Encounter for screening for malignant neoplasm of skin: Secondary | ICD-10-CM

## 2021-12-06 DIAGNOSIS — L942 Calcinosis cutis: Secondary | ICD-10-CM

## 2021-12-06 DIAGNOSIS — L309 Dermatitis, unspecified: Secondary | ICD-10-CM | POA: Diagnosis not present

## 2021-12-06 DIAGNOSIS — L988 Other specified disorders of the skin and subcutaneous tissue: Secondary | ICD-10-CM

## 2021-12-06 DIAGNOSIS — D1801 Hemangioma of skin and subcutaneous tissue: Secondary | ICD-10-CM

## 2021-12-06 MED ORDER — TRIAMCINOLONE ACETONIDE 0.1 % EX CREA: 1.0000 "application " | TOPICAL_CREAM | Freq: Two times a day (BID) | CUTANEOUS | 1 refills | Status: DC | PRN

## 2021-12-06 MED ORDER — METRONIDAZOLE 0.75 % EX CREA: TOPICAL_CREAM | CUTANEOUS | 5 refills | Status: DC

## 2021-12-06 MED ORDER — TRETINOIN 0.1 % EX CREA: TOPICAL_CREAM | Freq: Every day | CUTANEOUS | 5 refills | Status: DC

## 2021-12-06 NOTE — Patient Instructions (Addendum)
Start tretinoin 0.1% cream nightly to face as tolerated  Topical retinoid medications like tretinoin/Retin-A, adapalene/Differin, tazarotene/Fabior, and Epiduo/Epiduo Forte can cause dryness and irritation when first started. Only apply a pea-sized amount to the entire affected area. Avoid applying it around the eyes, edges of mouth and creases at the nose. If you experience irritation, use a good moisturizer first and/or apply the medicine less often. If you are doing well with the medicine, you can increase how often you use it until you are applying every night. Be careful with sun protection while using this medication as it can make you sensitive to the sun. This medicine should not be used by pregnant women.   Recommend vaseline and band-aid daily until healed to left elbow. When itchy, can use triamcinolone 0.1% cream twice daily for up to 2 weeks. Avoid applying to face, groin, and axilla. Use as directed. Long-term use can cause thinning of the skin.  Topical steroids (such as triamcinolone, fluocinolone, fluocinonide, mometasone, clobetasol, halobetasol, betamethasone, hydrocortisone) can cause thinning and lightening of the skin if they are used for too long in the same area. Your physician has selected the right strength medicine for your problem and area affected on the body. Please use your medication only as directed by your physician to prevent side effects.   Melanoma ABCDEs  Melanoma is the most dangerous type of skin cancer, and is the leading cause of death from skin disease.  You are more likely to develop melanoma if you: Have light-colored skin, light-colored eyes, or red or blond hair Spend a lot of time in the sun Tan regularly, either outdoors or in a tanning bed Have had blistering sunburns, especially during childhood Have a close family member who has had a melanoma Have atypical moles or large birthmarks  Early detection of melanoma is key since treatment is typically  straightforward and cure rates are extremely high if we catch it early.   The first sign of melanoma is often a change in a mole or a new dark spot.  The ABCDE system is a way of remembering the signs of melanoma.  A for asymmetry:  The two halves do not match. B for border:  The edges of the growth are irregular. C for color:  A mixture of colors are present instead of an even brown color. D for diameter:  Melanomas are usually (but not always) greater than 66mm - the size of a pencil eraser. E for evolution:  The spot keeps changing in size, shape, and color.  Please check your skin once per month between visits. You can use a small mirror in front and a large mirror behind you to keep an eye on the back side or your body.   If you see any new or changing lesions before your next follow-up, please call to schedule a visit.  Please continue daily skin protection including broad spectrum sunscreen SPF 30+ to sun-exposed areas, reapplying every 2 hours as needed when you're outdoors.    If You Need Anything After Your Visit  If you have any questions or concerns for your doctor, please call our main line at [phone] and press option 4 to reach your doctor's medical assistant. If no one answers, please leave a voicemail as directed and we will return your call as soon as possible. Messages left after 4 pm will be answered the following business day.   You may also send [place] a message via [place]. We typically respond to MyChart messages  within 1-2 business days.  For prescription refills, please ask your pharmacy to contact our office. Our fax number is [phone].  If you have an urgent issue when the clinic is closed that cannot wait until the next business day, you can page your doctor at the number below.    Please note that while we do our best to be available for urgent issues outside of office hours, we are not available 24/7.   If you have an urgent issue and are unable to reach  [place], you may choose to seek medical care at your doctor's office, retail clinic, urgent care center, or emergency room.  If you have a medical emergency, please immediately call 911 or go to the emergency department.  Pager Numbers  - Dr. Nehemiah Massed: [phone]  - Dr. Laurence Ferrari: [phone]  - Dr. Nicole Kindred: [phone]  In the event of inclement weather, please call our main line at [phone] for an update on the status of any delays or closures.  Dermatology Medication Tips: Please keep the boxes that topical medications come in in order to help keep track of the instructions about where and how to use these. Pharmacies typically print the medication instructions only on the boxes and not directly on the medication tubes.   If your medication is too expensive, please contact our office at [phone] option 4 or send [place] a message through [place].   We are unable to tell what your co-pay for medications will be in advance as this is different depending on your insurance coverage. However, we may be able to find a substitute medication at lower cost or fill out paperwork to get insurance to cover a needed medication.   If a prior authorization is required to get your medication covered by your insurance company, please allow [place] 1-2 business days to complete this process.  Drug prices often vary depending on where the prescription is filled and some pharmacies may offer cheaper prices.  The website www.goodrx.com contains coupons for medications through different pharmacies. The prices here do not account for what the cost may be with help from insurance (it may be cheaper with your insurance), but the website can give you the price if you did not use any insurance.  - You can print the associated coupon and take it with your prescription to the pharmacy.  - You may also stop by our office during regular business hours and pick up a GoodRx coupon card.  - If you need your prescription sent  electronically to a different pharmacy, notify our office through [facility] or by phone at [phone] option 4.     Si Usted Necesita Algo Despus de Su Visita  Tambin puede enviarnos un mensaje a travs de Pharmacist, community. Por lo general respondemos a los mensajes de MyChart en el transcurso de 1 a 2 das hbiles.  Para renovar recetas, por favor pida a su farmacia que se ponga en contacto con nuestra oficina. Harland Dingwall de fax es [place] [phone].  Si tiene un asunto urgente cuando la clnica est cerrada y que no puede esperar hasta el siguiente da hbil, puede llamar/localizar a su doctor(a) al nmero que aparece a continuacin.   Por favor, tenga en cuenta que aunque hacemos todo lo posible para estar disponibles para asuntos urgentes fuera del horario de [place], no estamos disponibles las 24 horas del da, los 7 das de la [place].   Si tiene un problema urgente y no puede comunicarse con nosotros, puede optar por  buscar atencin mdica  en el consultorio de su doctor(a), en una clnica privada, en un centro de atencin urgente o en una sala de emergencias.  Si tiene Engineering geologist, por favor llame inmediatamente al 911 o vaya a la sala de emergencias.  Nmeros de bper  - Dr. Nehemiah Massed: [phone]  - Dra. Moye: [phone]  - Dra. Nicole Kindred: [phone]  En caso de inclemencias del [place], por favor llame a Johnsie Kindred principal al [phone] para una actualizacin sobre el [place] de cualquier retraso o cierre.  Consejos para la medicacin en dermatologa: Por favor, guarde las cajas en las que vienen los medicamentos de uso tpico para ayudarle a seguir las instrucciones sobre dnde y cmo usarlos. Las farmacias generalmente imprimen las instrucciones del medicamento slo en las cajas y no directamente en los tubos del [place].   Si su medicamento es muy caro, por favor, pngase en contacto con Zigmund Daniel llamando al [phone] y presione la  opcin 4 o envenos un mensaje a travs de Pharmacist, community.   No podemos decirle cul ser su copago por los medicamentos por adelantado ya que esto es diferente dependiendo de la cobertura de su seguro. Sin embargo, es posible que podamos encontrar un medicamento sustituto a Electrical engineer un formulario para que el seguro cubra el medicamento que se considera necesario.   Si se requiere una autorizacin previa para que su compaa de seguros [place] su medicamento, por favor permtanos de 1 a 2 das hbiles para completar este proceso.  Los precios de los medicamentos varan con frecuencia dependiendo del Environmental consultant de dnde se surte la receta y alguna farmacias pueden ofrecer precios ms baratos.  El sitio web www.goodrx.com tiene cupones para medicamentos de Airline pilot. Los precios aqu no tienen en cuenta lo que podra costar con la ayuda del seguro (puede ser ms barato con su seguro), pero el sitio web puede darle el precio si no utiliz Research scientist (physical sciences).  - Puede imprimir el cupn correspondiente y llevarlo con su receta a la farmacia.  - Tambin puede pasar por nuestra oficina durante el horario de atencin regular y Charity fundraiser una tarjeta de cupones de GoodRx.  - Si necesita que su receta se enve electrnicamente a una farmacia diferente, informe a nuestra oficina a travs de MyChart de [place] o por telfono llamando al [phone] y presione la opcin 4.

## 2021-12-06 NOTE — Progress Notes (Signed)
Follow-Up Visit   Subjective  Samantha Dean is a 65 y.o. female who presents for the following: FBSE (Patient here for full body skin exam and skin cancer screening. Patient with no hx of skin cancer. Patient is using metronidazole 0.75% for rosacea and tretinoin 0.1% cream for acne. Patient advises the rosacea and acne has improved but still has some redness. ).  Patient does have a new spot at face that is flat and she would like it checked.   The following portions of the chart were reviewed this encounter and updated as appropriate:   Tobacco  Allergies  Meds  Problems  Med Hx  Surg Hx  Fam Hx      Review of Systems:  No other skin or systemic complaints except as noted in HPI or Assessment and Plan.  Objective  Well appearing patient in no apparent distress; mood and affect are within normal limits.  A full examination was performed including scalp, head, eyes, ears, nose, lips, neck, chest, axillae, abdomen, back, buttocks, bilateral upper extremities, bilateral lower extremities, hands, feet, fingers, toes, fingernails, and toenails. All findings within normal limits unless otherwise noted below.  bilateral cheeks Firm skin colored papules bilateral cheeks  face Rare open comedone  Left Inframammary Fold Clear today  Left Elbow Scaly pink papules coalescing to plaques with excoriation   face Mild mid face erythema    Assessment & Plan  Osteoma cutis bilateral cheeks  Improving gradually over time with topical tretinoin  D/c tretinoin 0.05% cream.   Start tretinoin 0.1% cream nightly as tolerated  Topical retinoid medications like tretinoin/Retin-A, adapalene/Differin, tazarotene/Fabior, and Epiduo/Epiduo Forte can cause dryness and irritation when first started. Only apply a pea-sized amount to the entire affected area. Avoid applying it around the eyes, edges of mouth and creases at the nose. If you experience irritation, use a good moisturizer first  and/or apply the medicine less often. If you are doing well with the medicine, you can increase how often you use it until you are applying every night. Be careful with sun protection while using this medication as it can make you sensitive to the sun. This medicine should not be used by pregnant women.    Acne vulgaris face  Chronic condition with duration over one year. Currently well-controlled.  D/c tretinoin 0.05% cream  Start tretinoin 0.1% cream nightly as tolerated  Topical retinoid medications like tretinoin/Retin-A, adapalene/Differin, tazarotene/Fabior, and Epiduo/Epiduo Forte can cause dryness and irritation when first started. Only apply a pea-sized amount to the entire affected area. Avoid applying it around the eyes, edges of mouth and creases at the nose. If you experience irritation, use a good moisturizer first and/or apply the medicine less often. If you are doing well with the medicine, you can increase how often you use it until you are applying every night. Be careful with sun protection while using this medication as it can make you sensitive to the sun. This medicine should not be used by pregnant women.     Erythema intertrigo Left Inframammary Fold  Intertrigo is a chronic recurrent rash that occurs in skin fold areas that may be associated with friction; heat; moisture; yeast; fungus; and bacteria.  It is exacerbated by increased movement / activity; sweating; and higher atmospheric temperature.  Continue Nystatin powder as prescribed  Dermatitis Left Elbow  Recommend vaseline and band-aid daily until healed to left elbow. When itchy, can use triamcinolone 0.1% cream twice daily for up to 2 weeks. Avoid applying  to face, groin, and axilla. Use as directed. Long-term use can cause thinning of the skin.  Topical steroids (such as triamcinolone, fluocinolone, fluocinonide, mometasone, clobetasol, halobetasol, betamethasone, hydrocortisone) can cause thinning and  lightening of the skin if they are used for too long in the same area. Your physician has selected the right strength medicine for your problem and area affected on the body. Please use your medication only as directed by your physician to prevent side effects.   triamcinolone cream (KENALOG) 0.1 % - Left Elbow Apply 1 application topically 2 (two) times daily as needed. For up to 2 weeks. Avoid applying to face, groin, and axilla. Use as directed. Long-term use can cause thinning of the skin.  Rosacea face  Rosacea is a chronic progressive skin condition usually affecting the face of adults, causing redness and/or acne bumps. It is treatable but not curable. It sometimes affects the eyes (ocular rosacea) as well. It may respond to topical and/or systemic medication and can flare with stress, sun exposure, alcohol, exercise and some foods.  Daily application of broad spectrum spf 30+ sunscreen to face is recommended to reduce flares.  Chronic condition with duration or expected duration over one year. Currently well-controlled.  Continue metronidazole 0.75% twice daily   Lentigines - Scattered tan macules - Due to sun exposure - Benign-appearing, observe - Recommend daily broad spectrum sunscreen SPF 30+ to sun-exposed areas, reapply every 2 hours as needed. - Call for any changes  Seborrheic Keratoses - Stuck-on, waxy, tan-brown papules and/or plaques  - Benign-appearing - Discussed benign etiology and prognosis. - Observe - Call for any changes  Melanocytic Nevi - Tan-brown and/or pink-flesh-colored symmetric macules and papules - Benign appearing on exam today - Observation - Call clinic for new or changing moles - Recommend daily use of broad spectrum spf 30+ sunscreen to sun-exposed areas.   Hemangiomas - Red papules - Discussed benign nature - Observe - Call for any changes  Actinic Damage - Chronic condition, secondary to cumulative UV/sun exposure - diffuse scaly  erythematous macules with underlying dyspigmentation - Recommend daily broad spectrum sunscreen SPF 30+ to sun-exposed areas, reapply every 2 hours as needed.  - Staying in the shade or wearing long sleeves, sun glasses (UVA+UVB protection) and wide brim hats (4-inch brim around the entire circumference of the hat) are also recommended for sun protection.  - Call for new or changing lesions.  Dermatofibroma - Firm pink/brown papulenodule with dimple sign at left shoulder - Benign appearing - Call for any changes  Skin cancer screening performed today.  Return in about 1 year (around 12/06/2022) for TBSE.  Graciella Belton, RMA, am acting as scribe for Forest Gleason, MD .]  Documentation: I have reviewed the above documentation for accuracy and completeness, and I agree with the above.  Forest Gleason, MD

## 2021-12-18 ENCOUNTER — Encounter: Payer: Self-pay | Admitting: Dermatology

## 2021-12-28 DIAGNOSIS — H9201 Otalgia, right ear: Secondary | ICD-10-CM | POA: Diagnosis not present

## 2021-12-28 DIAGNOSIS — B3789 Other sites of candidiasis: Secondary | ICD-10-CM | POA: Diagnosis not present

## 2022-01-03 ENCOUNTER — Ambulatory Visit
Admission: RE | Admit: 2022-01-03 | Discharge: 2022-01-03 | Disposition: A | Discharge status: Home / Self Care | Payer: Medicare HMO | Source: Ambulatory Visit | Attending: Internal Medicine | Admitting: Internal Medicine

## 2022-01-03 ENCOUNTER — Other Ambulatory Visit: Payer: Self-pay

## 2022-01-03 DIAGNOSIS — F209 Schizophrenia, unspecified: Secondary | ICD-10-CM | POA: Diagnosis not present

## 2022-01-03 DIAGNOSIS — Z1231 Encounter for screening mammogram for malignant neoplasm of breast: Secondary | ICD-10-CM | POA: Diagnosis not present

## 2022-01-05 ENCOUNTER — Telehealth: Payer: Self-pay | Admitting: Obstetrics & Gynecology

## 2022-01-08 NOTE — Telephone Encounter (Signed)
Called and left voicemail for patient to call back to be scheduled. 

## 2022-01-09 NOTE — Telephone Encounter (Signed)
Patient is scheduled 01/23/22 with ABC

## 2022-01-23 ENCOUNTER — Encounter: Payer: Self-pay | Admitting: Obstetrics and Gynecology

## 2022-01-23 ENCOUNTER — Other Ambulatory Visit: Payer: Self-pay

## 2022-01-23 ENCOUNTER — Ambulatory Visit (INDEPENDENT_AMBULATORY_CARE_PROVIDER_SITE_OTHER): Payer: Medicare HMO | Admitting: Obstetrics and Gynecology

## 2022-01-23 VITALS — BP 134/72 | Ht 60.0 in | Wt 215.0 lb

## 2022-01-23 DIAGNOSIS — Z13820 Encounter for screening for osteoporosis: Secondary | ICD-10-CM | POA: Diagnosis not present

## 2022-01-23 DIAGNOSIS — R399 Unspecified symptoms and signs involving the genitourinary system: Secondary | ICD-10-CM | POA: Diagnosis not present

## 2022-01-23 DIAGNOSIS — Z1211 Encounter for screening for malignant neoplasm of colon: Secondary | ICD-10-CM | POA: Diagnosis not present

## 2022-01-23 DIAGNOSIS — Z01419 Encounter for gynecological examination (general) (routine) without abnormal findings: Secondary | ICD-10-CM | POA: Diagnosis not present

## 2022-01-23 DIAGNOSIS — Z1231 Encounter for screening mammogram for malignant neoplasm of breast: Secondary | ICD-10-CM

## 2022-01-23 DIAGNOSIS — N898 Other specified noninflammatory disorders of vagina: Secondary | ICD-10-CM | POA: Diagnosis not present

## 2022-01-23 LAB — POCT WET PREP WITH KOH
Clue Cells Wet Prep HPF POC: NEGATIVE
KOH Prep POC: NEGATIVE
Trichomonas, UA: NEGATIVE
Yeast Wet Prep HPF POC: NEGATIVE

## 2022-01-23 LAB — POCT URINALYSIS DIPSTICK
Bilirubin, UA: NEGATIVE
Blood, UA: NEGATIVE
Glucose, UA: NEGATIVE
Ketones, UA: NEGATIVE
Leukocytes, UA: NEGATIVE
Nitrite, UA: NEGATIVE
Protein, UA: NEGATIVE
Spec Grav, UA: 1.015 (ref 1.010–1.025)
pH, UA: 7 (ref 5.0–8.0)

## 2022-01-23 MED ORDER — FLUCONAZOLE 150 MG PO TABS: 150.0000 mg | ORAL_TABLET | Freq: Once | ORAL | 0 refills | Status: AC

## 2022-01-23 NOTE — Patient Instructions (Signed)
I value your feedback and you entrusting us with your care. If you get a [place] patient survey, I would appreciate you taking the time to let us know about your experience today. Thank you! ? ? ?

## 2022-01-23 NOTE — Progress Notes (Signed)
PCP: Baxter Hire, MD   Chief Complaint  Patient presents with   Gynecologic Exam   Urinary Tract Infection    Frequency and burning urinating x 2-3 days    HPI:      [name] is a 66 y.o. G2P2002 whose LMP was No LMP recorded. Patient is postmenopausal., presents today for her Medicare annual examination.  Her menses are absent due to menopause, no PMB. Has occas vasomotor sx.   Sex activity: not sexually active. She does not have vaginal dryness.  Has had vaginal itching past 2 days, no increased d/c or odor. Was on amox 12/22. Also with dysuria/urine feels warm; urinary frequency with and without good flow. No hematuria, LBP, pelvic pain, fevers. Gets 1-2 UTIs yearly. Drinks lots of caffeine daily.   Last Pap: 01/22/21 Results were: no abnormalities /neg HPV DNA.  Hx of STDs: none  Last mammogram: 01/03/22 Results were: normal--routine follow-up in 12 months There is no FH of breast cancer. There is no FH of ovarian cancer. The patient does not do self-breast exams. There is  FH pancreatic cancer but pt doesn't meet Medicare genetic testing guidelines.   Colonoscopy: age 18, repeat after 10 yrs. Pt plans to do through PCP when she has time. Caregiver to parents currently.   DEXA with PCP due to fosamax tx; has appt 2/23  Tobacco use: The patient denies current or previous tobacco use. Alcohol use: none No drug use Exercise: min active  She does get adequate calcium and Vitamin D in her diet when takes her supp but hasn't been in past month.  Labs with PCP.   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   DIAGNOSTIC LAPAROSCOPY  1987   DILATION AND CURETTAGE OF UTERUS  approx 2004   [place]   ESOPHAGOGASTRODUODENOSCOPY (EGD) WITH PROPOFOL N/A 05/15/2016   Procedure: ESOPHAGOGASTRODUODENOSCOPY (EGD) WITH PROPOFOL;  Surgeon: Manya Silvas, MD;  Location: [facility] ENDOSCOPY;  Service: Endoscopy;  Laterality: N/A;   KNEE ARTHROSCOPY Right 07/17/2016   Procedure: ARTHROSCOPY KNEE WITH DEBRIDEMENT AND PARTIAL MEDIAL MENISCECTOMY right knee;  Surgeon: Corky Mull, MD;  Location: [facility];  Service: Orthopedics;  Laterality: Right;   KNEE ARTHROSCOPY Left 07/09/2017   Procedure: ARTHROSCOPY KNEE  with debridement and medial meniscectomy;  Surgeon: Corky Mull, MD;  Location: [place];  Service: Orthopedics;  Laterality: Left;   NOSE SURGERY     OVARIAN CYST REMOVAL  1986    Family History  Adopted: Yes  Problem Relation Age of Onset   Pancreatic cancer Mother    Breast cancer Neg Hx     Social History   Socioeconomic History   Marital status: Divorced    Spouse name: Not on file   Number of children: Not on file   Years of education: Not on file   Highest education level: Not on file  Occupational History   Not on file  Tobacco Use   Smoking status: Never   Smokeless tobacco: Never  Vaping Use   Vaping Use: Never used  Substance and Sexual Activity   Alcohol use: No   Drug use: No   Sexual activity: Not Currently    Birth control/protection: Post-menopausal  Other Topics Concern   Not on file  Social History Narrative   Not on file   Social Determinants of Health   Financial Resource Strain: Not on file  Food Insecurity: Not on file  Transportation Needs: Not on file  Physical Activity: Not on file  Stress: Not on file  Social Connections: Not on file  Intimate Partner Violence: Not on file     Current Outpatient Medications:    albuterol (VENTOLIN HFA) 108 (90 Base) MCG/ACT  inhaler, SMARTSIG:2 Inhalation Via Inhaler Every 6 Hours PRN, Disp: , Rfl:    alendronate (FOSAMAX) 70 MG tablet, Take 70 mg by mouth once a week. Take with a full glass of water on an empty stomach., Disp: , Rfl:    b complex vitamins tablet, Take 1 tablet by mouth daily., Disp: , Rfl:    Biotin 1 MG CAPS, Take by mouth., Disp: , Rfl:    butalbital-acetaminophen-caffeine (FIORICET) 50-325-40 MG tablet, Take 1 tablet by mouth every 6 (six) hours as needed., Disp: , Rfl:    Calcium Carbonate-Vit D-Min (CALCIUM 1200 PO), Take 2,400 mg by mouth daily., Disp: , Rfl:    clotrimazole-betamethasone (LOTRISONE) cream, APPLY EXTERNALLY TO THE AFFECTED AREA TWICE DAILY FOR UP TO 2 WEEKS, THEN TAKE A BREAK FROM IT, Disp: 30 g, Rfl: 1   cyanocobalamin 1000 MCG tablet, Take 1,000 mcg by mouth daily., Disp: , Rfl:    escitalopram (LEXAPRO) 20 MG tablet, Take by mouth., Disp: , Rfl:    fluconazole (DIFLUCAN) 150 MG tablet, Take 1 tablet (150 mg total) by mouth once for 1 dose. May repeat in 3 days if still having symptoms, Disp: 2 tablet, Rfl: 0   fluticasone (FLONASE) 50 MCG/ACT nasal spray, Place into both nostrils daily., Disp: , Rfl:    loratadine (CLARITIN) 10 MG tablet, Take 10 mg by mouth daily., Disp: , Rfl:    meloxicam (MOBIC) 15 MG tablet, , Disp: , Rfl: 0   metroNIDAZOLE (METROCREAM) 0.75 % cream, APP THIN LAYER TO FACE BID, Disp: 45 g, Rfl: 5   Multiple Vitamin (MULTIVITAMIN) capsule, Take 1 capsule by mouth daily., Disp: , Rfl:    nystatin (MYCOSTATIN/NYSTOP) powder, Apply topically 2 (two) times daily., Disp: , Rfl:    Omega 3 340 MG CPDR, Take by mouth daily., Disp: , Rfl:    perphenazine (TRILAFON) 2 MG tablet, Take by mouth., Disp: , Rfl:    rosuvastatin (CRESTOR) 10 MG tablet, Take 10 mg by mouth at bedtime., Disp: , Rfl:    tretinoin (RETIN-A) 0.1 % cream, Apply topically at bedtime., Disp: 45 g, Rfl: 5   triamcinolone cream (KENALOG) 0.1 %, Apply 1 application topically 2 (two) times daily  as needed. For up to 2 weeks. Avoid applying to face, groin, and axilla. Use as directed. Long-term use can cause thinning of the skin., Disp: 80 g, Rfl: 1   Zinc Sulfate 66 MG TABS, once daily, Disp: , Rfl:    azelastine (ASTELIN) 0.1 % nasal spray, Place into the nose., Disp: , Rfl:    montelukast (SINGULAIR) 10 MG tablet, Take by mouth., Disp: , Rfl:      ROS:  Review of Systems  Constitutional:  Negative for fatigue, fever and unexpected weight change.  Respiratory:  Negative for cough, shortness of breath and wheezing.   Cardiovascular:  Negative for  chest pain, palpitations and leg swelling.  Gastrointestinal:  Negative for blood in stool, constipation, diarrhea, nausea and vomiting.  Endocrine: Negative for cold intolerance, heat intolerance and polyuria.  Genitourinary:  Positive for dysuria and frequency. Negative for dyspareunia, flank pain, genital sores, hematuria, menstrual problem, pelvic pain, urgency, vaginal bleeding, vaginal discharge and vaginal pain.  Musculoskeletal:  Negative for back pain, joint swelling and myalgias.  Skin:  Negative for rash.  Neurological:  Negative for dizziness, syncope, light-headedness, numbness and headaches.  Hematological:  Negative for adenopathy.  Psychiatric/Behavioral:  Negative for agitation, confusion, sleep disturbance and suicidal ideas. The patient is not nervous/anxious.   BREAST: No symptoms    Objective: BP 134/72    Ht 5' (1.524 m)    Wt 215 lb (97.5 kg)    BMI 41.99 kg/m    Physical Exam Constitutional:      Appearance: She is well-developed.  Genitourinary:     Vulva normal.     Genitourinary Comments: BILAT LABIA MAJORA AND MINORA WITH ERYTHEMA; WHITISH D/C BILAT LABIA MINORA; TENDER TO TOUCH     Right Labia: rash and tenderness.     Right Labia: No lesions.    Left Labia: tenderness and rash.     Left Labia: No lesions.    No vaginal discharge, erythema or tenderness.      Right Adnexa: not tender and no  mass present.    Left Adnexa: not tender and no mass present.    No cervical friability or polyp.     Uterus is not enlarged or tender.  Breasts:    Right: No mass, nipple discharge, skin change or tenderness.     Left: No mass, nipple discharge, skin change or tenderness.  Neck:     Thyroid: No thyromegaly.  Cardiovascular:     Rate and Rhythm: Normal rate and regular rhythm.     Heart sounds: Normal heart sounds. No murmur heard. Pulmonary:     Effort: Pulmonary effort is normal.     Breath sounds: Normal breath sounds.  Abdominal:     Palpations: Abdomen is soft.     Tenderness: There is no abdominal tenderness. There is no guarding or rebound.  Musculoskeletal:        General: Normal range of motion.     Cervical back: Normal range of motion.  Lymphadenopathy:     Cervical: No cervical adenopathy.  Neurological:     General: No focal deficit present.     Mental Status: She is alert and oriented to person, place, and time.     Cranial Nerves: No cranial nerve deficit.  Skin:    General: Skin is warm and dry.  Psychiatric:        Mood and Affect: Mood normal.        Behavior: Behavior normal.        Thought Content: Thought content normal.        Judgment: Judgment normal.  Vitals reviewed.    Results: Results for orders placed or performed in visit on 01/23/22 (from the past 24 hour(s))  POCT Urinalysis Dipstick     Status: Normal   Collection Time: 01/23/22  2:06 PM  Result Value Ref Range   Color, UA YELLOW    Clarity, UA CLEAR    Glucose, UA Negative Negative   Bilirubin, UA neg    Ketones, UA neg    Spec Grav, UA 1.015 1.010 - 1.025   Blood, UA neg    pH, UA 7.0 5.0 -  8.0   Protein, UA Negative Negative   Urobilinogen, UA     Nitrite, UA neg    Leukocytes, UA Negative Negative   Appearance     Odor    POCT Wet Prep with KOH     Status: Normal   Collection Time: 01/23/22  2:55 PM  Result Value Ref Range   Trichomonas, UA Negative    Clue Cells Wet  Prep HPF POC neg    Epithelial Wet Prep HPF POC     Yeast Wet Prep HPF POC neg    Bacteria Wet Prep HPF POC     RBC Wet Prep HPF POC     WBC Wet Prep HPF POC     KOH Prep POC Negative Negative    Assessment/Plan:  Encounter for annual routine gynecological examination  Encounter for screening mammogram for malignant neoplasm of breast; pt current on mammo with PCP  Screening for colon cancer--pt past due and will do ref with PCP  Screening for osteoporosis--has appt 2/23 through PCP  UTI symptoms - Plan: POCT Urinalysis Dipstick, Urine Culture; pos sx, neg UA. Check C&S. Will f/u if positive. If neg, most likely due to vag sx.   Vaginal itching - Plan: POCT Wet Prep with KOH, fluconazole (DIFLUCAN) 150 MG tablet; pos sx, exam and neg wet prep. Rx diflucan. Will send in lotrisone crm if sx persist after diflucan. F/u prn.    Meds ordered this encounter  Medications   fluconazole (DIFLUCAN) 150 MG tablet    Sig: Take 1 tablet (150 mg total) by mouth once for 1 dose. May repeat in 3 days if still having symptoms    Dispense:  2 tablet    Refill:  0    Order Specific Question:   Supervising Provider    Answer:   Gae Dry [[id number]]            GYN counsel breast self exam, mammography screening, adequate intake of calcium and vitamin D, diet and exercise    F/U  Return in about 2 years (around 2/44/6286) for annual.  Caine Barfield B. Vandy Tsuchiya, PA-C 01/23/2022 2:56 PM

## 2022-01-25 LAB — URINE CULTURE: Organism ID, Bacteria: NO GROWTH

## 2022-01-29 ENCOUNTER — Encounter: Payer: Self-pay | Admitting: Obstetrics and Gynecology

## 2022-01-29 DIAGNOSIS — N898 Other specified noninflammatory disorders of vagina: Secondary | ICD-10-CM

## 2022-01-30 MED ORDER — CLOTRIMAZOLE-BETAMETHASONE 1-0.05 % EX CREA: TOPICAL_CREAM | CUTANEOUS | 0 refills | Status: DC

## 2022-03-11 DIAGNOSIS — J069 Acute upper respiratory infection, unspecified: Secondary | ICD-10-CM | POA: Diagnosis not present

## 2022-03-19 DIAGNOSIS — F209 Schizophrenia, unspecified: Secondary | ICD-10-CM | POA: Diagnosis not present

## 2022-03-22 DIAGNOSIS — H60503 Unspecified acute noninfective otitis externa, bilateral: Secondary | ICD-10-CM | POA: Diagnosis not present

## 2022-03-22 DIAGNOSIS — Z6841 Body Mass Index (BMI) 40.0 and over, adult: Secondary | ICD-10-CM | POA: Diagnosis not present

## 2022-04-04 DIAGNOSIS — H6122 Impacted cerumen, left ear: Secondary | ICD-10-CM | POA: Diagnosis not present

## 2022-05-21 DIAGNOSIS — E785 Hyperlipidemia, unspecified: Secondary | ICD-10-CM | POA: Diagnosis not present

## 2022-05-28 DIAGNOSIS — Z6841 Body Mass Index (BMI) 40.0 and over, adult: Secondary | ICD-10-CM | POA: Diagnosis not present

## 2022-05-28 DIAGNOSIS — F250 Schizoaffective disorder, bipolar type: Secondary | ICD-10-CM | POA: Diagnosis not present

## 2022-05-28 DIAGNOSIS — E669 Obesity, unspecified: Secondary | ICD-10-CM | POA: Diagnosis not present

## 2022-05-28 DIAGNOSIS — E282 Polycystic ovarian syndrome: Secondary | ICD-10-CM | POA: Diagnosis not present

## 2022-05-28 DIAGNOSIS — R300 Dysuria: Secondary | ICD-10-CM | POA: Diagnosis not present

## 2022-05-28 DIAGNOSIS — Z0000 Encounter for general adult medical examination without abnormal findings: Secondary | ICD-10-CM | POA: Diagnosis not present

## 2022-06-16 DIAGNOSIS — J069 Acute upper respiratory infection, unspecified: Secondary | ICD-10-CM | POA: Diagnosis not present

## 2022-06-16 DIAGNOSIS — R051 Acute cough: Secondary | ICD-10-CM | POA: Diagnosis not present

## 2022-06-25 DIAGNOSIS — F209 Schizophrenia, unspecified: Secondary | ICD-10-CM | POA: Diagnosis not present

## 2022-07-11 DIAGNOSIS — J019 Acute sinusitis, unspecified: Secondary | ICD-10-CM | POA: Diagnosis not present

## 2022-08-07 ENCOUNTER — Telehealth: Payer: Self-pay

## 2022-08-07 ENCOUNTER — Encounter: Payer: Self-pay | Admitting: Obstetrics and Gynecology

## 2022-08-07 ENCOUNTER — Other Ambulatory Visit: Payer: Self-pay | Admitting: Obstetrics and Gynecology

## 2022-08-07 DIAGNOSIS — N898 Other specified noninflammatory disorders of vagina: Secondary | ICD-10-CM

## 2022-08-07 MED ORDER — CLOTRIMAZOLE-BETAMETHASONE 1-0.05 % EX CREA: TOPICAL_CREAM | CUTANEOUS | 0 refills | Status: DC

## 2022-08-07 NOTE — Progress Notes (Signed)
Rx RF lotrisone crm prn vaginal itching.

## 2022-08-07 NOTE — Telephone Encounter (Signed)
Refill request recv'd from [facility] for clotrimazole-betamethasone cream 15g tube; last filled 05/12/2022. Left msg for pt to call and let us know if she is still having sxs and what those sxs are or send mychart msg.

## 2022-08-12 NOTE — Telephone Encounter (Signed)
Alicia sent in refill.

## 2022-09-06 DIAGNOSIS — J019 Acute sinusitis, unspecified: Secondary | ICD-10-CM | POA: Diagnosis not present

## 2022-09-16 DIAGNOSIS — F209 Schizophrenia, unspecified: Secondary | ICD-10-CM | POA: Diagnosis not present

## 2022-10-01 DIAGNOSIS — J329 Chronic sinusitis, unspecified: Secondary | ICD-10-CM | POA: Diagnosis not present

## 2022-10-01 DIAGNOSIS — R300 Dysuria: Secondary | ICD-10-CM | POA: Diagnosis not present

## 2022-10-31 DIAGNOSIS — R21 Rash and other nonspecific skin eruption: Secondary | ICD-10-CM | POA: Diagnosis not present

## 2022-10-31 DIAGNOSIS — R519 Headache, unspecified: Secondary | ICD-10-CM | POA: Diagnosis not present

## 2022-11-26 DIAGNOSIS — E785 Hyperlipidemia, unspecified: Secondary | ICD-10-CM | POA: Diagnosis not present

## 2022-11-26 DIAGNOSIS — E282 Polycystic ovarian syndrome: Secondary | ICD-10-CM | POA: Diagnosis not present

## 2022-11-29 DIAGNOSIS — F209 Schizophrenia, unspecified: Secondary | ICD-10-CM | POA: Diagnosis not present

## 2022-12-03 DIAGNOSIS — E282 Polycystic ovarian syndrome: Secondary | ICD-10-CM | POA: Diagnosis not present

## 2022-12-03 DIAGNOSIS — E669 Obesity, unspecified: Secondary | ICD-10-CM | POA: Diagnosis not present

## 2022-12-03 DIAGNOSIS — Z0001 Encounter for general adult medical examination with abnormal findings: Secondary | ICD-10-CM | POA: Diagnosis not present

## 2022-12-03 DIAGNOSIS — F250 Schizoaffective disorder, bipolar type: Secondary | ICD-10-CM | POA: Diagnosis not present

## 2022-12-03 DIAGNOSIS — Z6841 Body Mass Index (BMI) 40.0 and over, adult: Secondary | ICD-10-CM | POA: Diagnosis not present

## 2022-12-05 ENCOUNTER — Ambulatory Visit (INDEPENDENT_AMBULATORY_CARE_PROVIDER_SITE_OTHER): Payer: Medicare HMO | Admitting: Dermatology

## 2022-12-05 ENCOUNTER — Telehealth: Payer: Self-pay

## 2022-12-05 DIAGNOSIS — L719 Rosacea, unspecified: Secondary | ICD-10-CM | POA: Diagnosis not present

## 2022-12-05 DIAGNOSIS — L219 Seborrheic dermatitis, unspecified: Secondary | ICD-10-CM | POA: Diagnosis not present

## 2022-12-05 DIAGNOSIS — L814 Other melanin hyperpigmentation: Secondary | ICD-10-CM

## 2022-12-05 DIAGNOSIS — L304 Erythema intertrigo: Secondary | ICD-10-CM

## 2022-12-05 DIAGNOSIS — L821 Other seborrheic keratosis: Secondary | ICD-10-CM

## 2022-12-05 DIAGNOSIS — L700 Acne vulgaris: Secondary | ICD-10-CM | POA: Diagnosis not present

## 2022-12-05 DIAGNOSIS — L409 Psoriasis, unspecified: Secondary | ICD-10-CM | POA: Diagnosis not present

## 2022-12-05 DIAGNOSIS — L988 Other specified disorders of the skin and subcutaneous tissue: Secondary | ICD-10-CM | POA: Diagnosis not present

## 2022-12-05 DIAGNOSIS — B372 Candidiasis of skin and nail: Secondary | ICD-10-CM | POA: Diagnosis not present

## 2022-12-05 DIAGNOSIS — D229 Melanocytic nevi, unspecified: Secondary | ICD-10-CM

## 2022-12-05 DIAGNOSIS — L578 Other skin changes due to chronic exposure to nonionizing radiation: Secondary | ICD-10-CM

## 2022-12-05 DIAGNOSIS — Z1283 Encounter for screening for malignant neoplasm of skin: Secondary | ICD-10-CM | POA: Diagnosis not present

## 2022-12-05 DIAGNOSIS — D2362 Other benign neoplasm of skin of left upper limb, including shoulder: Secondary | ICD-10-CM

## 2022-12-05 MED ORDER — ZILXI 1.5 % EX FOAM: CUTANEOUS | 3 refills | Status: DC

## 2022-12-05 MED ORDER — KETOCONAZOLE 2 % EX SHAM: MEDICATED_SHAMPOO | CUTANEOUS | 3 refills | Status: DC

## 2022-12-05 MED ORDER — KETOCONAZOLE 2 % EX CREA: TOPICAL_CREAM | CUTANEOUS | 0 refills | Status: AC

## 2022-12-05 MED ORDER — FLUCONAZOLE 150 MG PO TABS: ORAL_TABLET | ORAL | 1 refills | Status: AC

## 2022-12-05 MED ORDER — TRETINOIN 0.1 % EX CREA: TOPICAL_CREAM | Freq: Every day | CUTANEOUS | 5 refills | Status: DC

## 2022-12-05 NOTE — Progress Notes (Addendum)
Follow-Up Visit   Subjective  Samantha Dean is a 66 y.o. female who presents for the following: Annual Exam (Hx acne and osteoma cutis - currently using Tretinoin 0.1% cream. Hx rosacea currently using Metronidazole 0.75% QD a few times per week but face burns and stings when she uses it. Hx intertrigo inframammary - uses Lotrisone and Nystatin powder but notices peeling after using Lotrisone). Hx rash on the L elbow which hasn't improved since last year for which she is using Lotrisone.   The following portions of the chart were reviewed this encounter and updated as appropriate:   Tobacco  Allergies  Meds  Problems  Med Hx  Surg Hx  Fam Hx      Review of Systems:  No other skin or systemic complaints except as noted in HPI or Assessment and Plan.  Objective  Well appearing patient in no apparent distress; mood and affect are within normal limits.  A full examination was performed including scalp, head, eyes, ears, nose, lips, neck, chest, axillae, abdomen, back, buttocks, bilateral upper extremities, bilateral lower extremities, hands, feet, fingers, toes, fingernails, and toenails. All findings within normal limits unless otherwise noted below.  Face Trace open comedones  Face Mid face erythema with telangiectasias +/- scattered inflammatory papules.   B/L cheeks Skin colored papules, improved  Scalp Pink patches with greasy scale.   L elbow No finger pits. Hx of joint aches.  Inframammary, lower abdomen Erythematous patches with satellite papules.    Assessment & Plan  Acne vulgaris Face  Chronic condition with duration or expected duration over one year. Currently well-controlled.  Continue Tretinoin 0.1% cream QHS. Topical retinoid medications like tretinoin/Retin-A, adapalene/Differin, tazarotene/Fabior, and Epiduo/Epiduo Forte can cause dryness and irritation when first started. Only apply a pea-sized amount to the entire affected area. Avoid applying it  around the eyes, edges of mouth and creases at the nose. If you experience irritation, use a good moisturizer first and/or apply the medicine less often. If you are doing well with the medicine, you can increase how often you use it until you are applying every night. Be careful with sun protection while using this medication as it can make you sensitive to the sun. This medicine should not be used by pregnant women.    Related Medications tretinoin (RETIN-A) 0.1 % cream Apply topically at bedtime.  Rosacea Face  Chronic and persistent condition with duration or expected duration over one year. Condition is symptomatic/ bothersome to patient. Not currently at goal and with side effect (stinging and burning) from medication.   Rosacea is a chronic progressive skin condition usually affecting the face of adults, causing redness and/or acne bumps. It is treatable but not curable. It sometimes affects the eyes (ocular rosacea) as well. It may respond to topical and/or systemic medication and can flare with stress, sun exposure, alcohol, exercise, topical steroids (including hydrocortisone/cortisone 10) and some foods.  Daily application of broad spectrum spf 30+ sunscreen to face is recommended to reduce flares.  D/C Metronidazole due to burning and stinging.   Start Zilxi foam to face QD. Samples given.   Minocycline HCl Micronized (ZILXI) 1.5 % FOAM - Face For rosacea apply a thin coat to the face QD.  Osteoma cutis B/L cheeks  Doing well  Continue Tretinoin 0.1% cream QHS. Topical retinoid medications like tretinoin/Retin-A, adapalene/Differin, tazarotene/Fabior, and Epiduo/Epiduo Forte can cause dryness and irritation when first started. Only apply a pea-sized amount to the entire affected area. Avoid applying it around  the eyes, edges of mouth and creases at the nose. If you experience irritation, use a good moisturizer first and/or apply the medicine less often. If you are doing well with  the medicine, you can increase how often you use it until you are applying every night. Be careful with sun protection while using this medication as it can make you sensitive to the sun. This medicine should not be used by pregnant women.    Seborrheic dermatitis Scalp  Chronic and persistent condition with duration or expected duration over one year. Condition is symptomatic/ bothersome to patient. Not currently at goal.  Seborrheic Dermatitis  -  is a chronic persistent rash characterized by pinkness and scaling most commonly of the mid face but also can occur on the scalp (dandruff), ears; mid chest, mid back and groin.  It tends to be exacerbated by stress and cooler weather.  People who have neurologic disease may experience new onset or exacerbation of existing seborrheic dermatitis.  The condition is not curable but treatable and can be controlled.  Start Ketoconazole 2% shampoo let sit 5-10 minutes before washing out. Use 2-3 days per week.   ketoconazole (NIZORAL) 2 % shampoo - Scalp Shampoo into scalp let sit 5-10 minutes then wash out. Use 2-3 days per week.  Psoriasis L elbow  Vs dermatitis but more consistent with psoriasis on exam today.  Chronic and persistent condition with duration or expected duration over one year. Condition is symptomatic/ bothersome to patient. Not currently at goal.  Pt also has joint pain that improves after about a hour of moving around. Patient being seen by Dr. Edwina Barth for autoimmune vs connective tissue disease vs other.  Patient has failed topical steroids for rash on L elbow.   Consider referral to rheumatology to r/o PsA. Will consult with Dr. Wynetta Emery first.   Start Vtama cream (sample given today) if not improving with Vtama patient to contact office and hopefully we will have Zoryve samples at that time.  Psoriasis is a chronic non-curable, but treatable genetic/hereditary disease that may have other systemic features affecting other  organ systems such as joints (Psoriatic Arthritis). It is associated with an increased risk of inflammatory bowel disease, heart disease, non-alcoholic fatty liver disease, and depression.     Candidal intertrigo Inframammary, lower abdomen  Consistent with yeast today -  Intertrigo is a chronic recurrent rash that occurs in skin fold areas that may be associated with friction; heat; moisture; yeast; fungus; and bacteria.  It is exacerbated by increased movement / activity; sweating; and higher atmospheric temperature.  D/C Lotrisone. Start Ketoconazole 2% cream QD. Once clear start Zeasorb AF powder to prevent recurrence.   Start Fluconazole 150 take one po QD repeat in one week. Side effects of fluconazole (diflucan) include nausea, diarrhea, headache, dizziness, taste changes, rare risk of irritation of the liver, allergy, or decreased blood counts (which could show up as infection or tiredness).    fluconazole (DIFLUCAN) 150 MG tablet - Inframammary, lower abdomen Take one tab po QD for one dose. Repeat in one week.  ketoconazole (NIZORAL) 2 % cream - Inframammary, lower abdomen Apply to rash under breast QD PRN.  Lentigines - Scattered tan macules - Due to sun exposure - Benign-appearing, observe - Recommend daily broad spectrum sunscreen SPF 30+ to sun-exposed areas, reapply every 2 hours as needed. - Call for any changes  Seborrheic Keratoses - Stuck-on, waxy, tan-brown papules and/or plaques  - Benign-appearing - Discussed benign etiology and prognosis. - Observe -  Call for any changes  Melanocytic Nevi - Tan-brown and/or pink-flesh-colored symmetric macules and papules - Benign appearing on exam today - Observation - Call clinic for new or changing moles - Recommend daily use of broad spectrum spf 30+ sunscreen to sun-exposed areas.   Hemangiomas - Red papules - Discussed benign nature - Observe - Call for any changes  Actinic Damage - Chronic condition,  secondary to cumulative UV/sun exposure - diffuse scaly erythematous macules with underlying dyspigmentation - Recommend daily broad spectrum sunscreen SPF 30+ to sun-exposed areas, reapply every 2 hours as needed.  - Staying in the shade or wearing long sleeves, sun glasses (UVA+UVB protection) and wide brim hats (4-inch brim around the entire circumference of the hat) are also recommended for sun protection.  - Call for new or changing lesions.  Dermatofibroma - L post shoulder  - Firm pink/brown papulenodule with dimple sign - Benign appearing - Call for any changes  Skin cancer screening performed today.  Return for 6 month for follow up to recheck conditions.  Luther Redo, CMA, am acting as scribe for Forest Gleason, MD .  Documentation: I have reviewed the above documentation for accuracy and completeness, and I agree with the above.  Forest Gleason, MD

## 2022-12-05 NOTE — Telephone Encounter (Signed)
Patient has [employer]. Zilxi not covered and patient unable to use any cash paying options. aw

## 2022-12-05 NOTE — Patient Instructions (Addendum)
Recommend Zeasorb AF powder for skin under breast   Due to recent changes in healthcare laws, you may see results of your pathology and/or laboratory studies on MyChart before the doctors have had a chance to review them. We understand that in some cases there may be results that are confusing or concerning to you. Please understand that not all results are received at the same time and often the doctors may need to interpret multiple results in order to provide you with the best plan of care or course of treatment. Therefore, we ask that you please give [place] 2 business days to thoroughly review all your results before contacting the office for clarification. Should we see a critical lab result, you will be contacted sooner.   If You Need Anything After Your Visit  If you have any questions or concerns for your doctor, please call our main line at [phone] and press option 4 to reach your doctor's medical assistant. If no one answers, please leave a voicemail as directed and we will return your call as soon as possible. Messages left after 4 pm will be answered the following business day.   You may also send [place] a message via [place]. We typically respond to MyChart messages within 1-2 business days.  For prescription refills, please ask your pharmacy to contact our office. Our fax number is [phone].  If you have an urgent issue when the clinic is closed that cannot wait until the next business day, you can page your doctor at the number below.    Please note that while we do our best to be available for urgent issues outside of office hours, we are not available 24/7.   If you have an urgent issue and are unable to reach [place], you may choose to seek medical care at your doctor's office, retail clinic, urgent care center, or emergency room.  If you have a medical emergency, please immediately call 911 or go to the emergency department.  Pager Numbers  - Dr. Nehemiah Massed: [phone]  - Dr.  Laurence Ferrari: [phone]  - Dr. Nicole Kindred: [phone]  In the event of inclement weather, please call our main line at [phone] for an update on the status of any delays or closures.  Dermatology Medication Tips: Please keep the boxes that topical medications come in in order to help keep track of the instructions about where and how to use these. Pharmacies typically print the medication instructions only on the boxes and not directly on the medication tubes.   If your medication is too expensive, please contact our office at [phone] option 4 or send [place] a message through [place].   We are unable to tell what your co-pay for medications will be in advance as this is different depending on your insurance coverage. However, we may be able to find a substitute medication at lower cost or fill out paperwork to get insurance to cover a needed medication.   If a prior authorization is required to get your medication covered by your insurance company, please allow [place] 1-2 business days to complete this process.  Drug prices often vary depending on where the prescription is filled and some pharmacies may offer cheaper prices.  The website www.goodrx.com contains coupons for medications through different pharmacies. The prices here do not account for what the cost may be with help from insurance (it may be cheaper with your insurance), but the website can give you the price if you did not use any insurance.  -  You can print the associated coupon and take it with your prescription to the pharmacy.  - You may also stop by our office during regular business hours and pick up a GoodRx coupon card.  - If you need your prescription sent electronically to a different pharmacy, notify our office through [facility] or by phone at [phone] option 4.     Si Usted Necesita Algo Despus de Su Visita  Tambin puede enviarnos un mensaje a travs de Pharmacist, community. Por lo general respondemos a los mensajes  de MyChart en el transcurso de 1 a 2 das hbiles.  Para renovar recetas, por favor pida a su farmacia que se ponga en contacto con nuestra oficina. Harland Dingwall de fax es [place] [phone].  Si tiene un asunto urgente cuando la clnica est cerrada y que no puede esperar hasta el siguiente da hbil, puede llamar/localizar a su doctor(a) al nmero que aparece a continuacin.   Por favor, tenga en cuenta que aunque hacemos todo lo posible para estar disponibles para asuntos urgentes fuera del horario de [place], no estamos disponibles las 24 horas del da, los 7 das de la [place].   Si tiene un problema urgente y no puede comunicarse con nosotros, puede optar por buscar atencin mdica  en el consultorio de su doctor(a), en una clnica privada, en un centro de atencin urgente o en una sala de emergencias.  Si tiene Engineering geologist, por favor llame inmediatamente al 911 o vaya a la sala de emergencias.  Nmeros de bper  - Dr. Nehemiah Massed: [phone]  - Dra. Moye: [phone]  - Dra. Nicole Kindred: [phone]  En caso de inclemencias del [place], por favor llame a Johnsie Kindred principal al [phone] para una actualizacin sobre el [place] de cualquier retraso o cierre.  Consejos para la medicacin en dermatologa: Por favor, guarde las cajas en las que vienen los medicamentos de uso tpico para ayudarle a seguir las instrucciones sobre dnde y cmo usarlos. Las farmacias generalmente imprimen las instrucciones del medicamento slo en las cajas y no directamente en los tubos del [place].   Si su medicamento es muy caro, por favor, pngase en contacto con Zigmund Daniel llamando al [phone] y presione la opcin 4 o envenos un mensaje a travs de Pharmacist, community.   No podemos decirle cul ser su copago por los medicamentos por adelantado ya que esto es diferente dependiendo de la cobertura de su seguro. Sin embargo, es posible que podamos encontrar un medicamento sustituto a Actor un formulario para que el seguro cubra el medicamento que se considera necesario.   Si se requiere una autorizacin previa para que su compaa de seguros [place] su medicamento, por favor permtanos de 1 a 2 das hbiles para completar este proceso.  Los precios de los medicamentos varan con frecuencia dependiendo del Environmental consultant de dnde se surte la receta y alguna farmacias pueden ofrecer precios ms baratos.  El sitio web www.goodrx.com tiene cupones para medicamentos de Airline pilot. Los precios aqu no tienen en cuenta lo que podra costar con la ayuda del seguro (puede ser ms barato con su seguro), pero el sitio web puede darle el precio si no utiliz Research scientist (physical sciences).  - Puede imprimir el cupn correspondiente y llevarlo con su receta a la farmacia.  - Tambin puede pasar por nuestra oficina durante el horario de atencin regular y Charity fundraiser una tarjeta de cupones de GoodRx.  - Si necesita que su receta se enve electrnicamente a una farmacia diferente, informe  a nuestra oficina a travs de MyChart de [place] o por telfono llamando al [phone] y presione la opcin 4.

## 2022-12-05 NOTE — Telephone Encounter (Signed)
The next best option price-wise after the metronidazole cream would probably be the compounded skin medicinals ivermectin-oxymetazoline cream (a bump/inflammation fighting cream and redness cream combined in one) which is around $60 and should be applied once a day. If that would work for her, we can send that in.    The other option we could try which may come out a bit cheaper would be low dose doxycycline pills (20 mg twice a day with food but not with dairy). They work well for inflammation and are usually reasonably priced (including with the goodrx coupon card if not covered by insurance), but can cause sun sensitivity and sometimes upset stomach particularly if not taken with food. There is a rare risk of allergy, and it is important to stay upright for about 30 minutes after taking the pills to prevent irritation of the esophagus. I am find sending in either for her.   Thank you!

## 2022-12-09 MED ORDER — DOXYCYCLINE HYCLATE 20 MG PO TABS: 20.0000 mg | ORAL_TABLET | Freq: Two times a day (BID) | ORAL | 2 refills | Status: DC

## 2022-12-09 NOTE — Addendum Note (Signed)
Addended by: Johnsie Kindred R on: 12/09/2022 12:01 PM   Modules accepted: Orders

## 2022-12-09 NOTE — Telephone Encounter (Signed)
Spoke with patient and advised her of information per Dr. Laurence Ferrari. Patient has chosen to take Doxycycline. RX sent in . aw

## 2022-12-09 NOTE — Telephone Encounter (Signed)
Left VM for patient to return my call. aw

## 2022-12-18 ENCOUNTER — Other Ambulatory Visit: Payer: Self-pay | Admitting: Dermatology

## 2022-12-18 DIAGNOSIS — L700 Acne vulgaris: Secondary | ICD-10-CM

## 2022-12-19 ENCOUNTER — Encounter: Payer: Self-pay | Admitting: Dermatology

## 2023-01-31 ENCOUNTER — Emergency Department
Admission: EM | Admit: 2023-01-31 | Discharge: 2023-01-31 | Disposition: A | Discharge status: Home / Self Care | Payer: Medicare HMO | Attending: Emergency Medicine | Admitting: Emergency Medicine

## 2023-01-31 ENCOUNTER — Emergency Department: Payer: Medicare HMO

## 2023-01-31 ENCOUNTER — Other Ambulatory Visit: Payer: Self-pay

## 2023-01-31 DIAGNOSIS — M62830 Muscle spasm of back: Secondary | ICD-10-CM | POA: Insufficient documentation

## 2023-01-31 DIAGNOSIS — E876 Hypokalemia: Secondary | ICD-10-CM | POA: Insufficient documentation

## 2023-01-31 DIAGNOSIS — W010XXA Fall on same level from slipping, tripping and stumbling without subsequent striking against object, initial encounter: Secondary | ICD-10-CM | POA: Diagnosis not present

## 2023-01-31 DIAGNOSIS — W19XXXA Unspecified fall, initial encounter: Secondary | ICD-10-CM

## 2023-01-31 DIAGNOSIS — Z91040 Latex allergy status: Secondary | ICD-10-CM | POA: Diagnosis not present

## 2023-01-31 DIAGNOSIS — S0990XA Unspecified injury of head, initial encounter: Secondary | ICD-10-CM | POA: Diagnosis present

## 2023-01-31 DIAGNOSIS — Z043 Encounter for examination and observation following other accident: Secondary | ICD-10-CM | POA: Diagnosis not present

## 2023-01-31 DIAGNOSIS — S060X0A Concussion without loss of consciousness, initial encounter: Secondary | ICD-10-CM | POA: Insufficient documentation

## 2023-01-31 LAB — URINALYSIS, ROUTINE W REFLEX MICROSCOPIC
Bilirubin Urine: NEGATIVE
Glucose, UA: NEGATIVE mg/dL
Hgb urine dipstick: NEGATIVE
Ketones, ur: NEGATIVE mg/dL
Leukocytes,Ua: NEGATIVE
Nitrite: NEGATIVE
Protein, ur: NEGATIVE mg/dL
Specific Gravity, Urine: 1.001 — ABNORMAL LOW (ref 1.005–1.030)
pH: 6 (ref 5.0–8.0)

## 2023-01-31 LAB — CBC
HCT: 43.4 % (ref 36.0–46.0)
Hemoglobin: 14.1 g/dL (ref 12.0–15.0)
MCH: 28.8 pg (ref 26.0–34.0)
MCHC: 32.5 g/dL (ref 30.0–36.0)
MCV: 88.8 fL (ref 80.0–100.0)
Platelets: 263 10*3/uL (ref 150–400)
RBC: 4.89 MIL/uL (ref 3.87–5.11)
RDW: 13.3 % (ref 11.5–15.5)
WBC: 6.2 10*3/uL (ref 4.0–10.5)
nRBC: 0 % (ref 0.0–0.2)

## 2023-01-31 LAB — BASIC METABOLIC PANEL
Anion gap: 8 (ref 5–15)
BUN: 8 mg/dL (ref 8–23)
CO2: 27 mmol/L (ref 22–32)
Calcium: 8.5 mg/dL — ABNORMAL LOW (ref 8.9–10.3)
Chloride: 103 mmol/L (ref 98–111)
Creatinine, Ser: 0.6 mg/dL (ref 0.44–1.00)
GFR, Estimated: >60 mL/min (ref >60)
Glucose, Bld: 115 mg/dL — ABNORMAL HIGH (ref 70–99)
Potassium: 3.2 mmol/L — ABNORMAL LOW (ref 3.5–5.1)
Sodium: 138 mmol/L (ref 135–145)

## 2023-01-31 LAB — CBG MONITORING, ED: Glucose-Capillary: 109 mg/dL — ABNORMAL HIGH (ref 70–99)

## 2023-01-31 MED ORDER — CYCLOBENZAPRINE HCL 5 MG PO TABS: 5.0000 mg | ORAL_TABLET | Freq: Three times a day (TID) | ORAL | 0 refills | Status: DC | PRN

## 2023-01-31 MED ORDER — POTASSIUM CHLORIDE CRYS ER 20 MEQ PO TBCR
10.0000 meq | EXTENDED_RELEASE_TABLET | Freq: Once | ORAL | Status: AC
  Administered 2023-01-31: 10 meq via ORAL
  Filled 2023-01-31: qty 1

## 2023-01-31 MED ORDER — POTASSIUM CHLORIDE ER 10 MEQ PO TBCR: 10.0000 meq | EXTENDED_RELEASE_TABLET | Freq: Every day | ORAL | 0 refills | Status: DC

## 2023-01-31 MED ORDER — ACETAMINOPHEN 500 MG PO TABS
1000.0000 mg | ORAL_TABLET | Freq: Once | ORAL | Status: AC
  Administered 2023-01-31: 1000 mg via ORAL
  Filled 2023-01-31: qty 2

## 2023-01-31 NOTE — ED Triage Notes (Signed)
Pt to ED from home for fall that happened yesterday around 330pm. Pt went inside had dinner and bed as normal. Pt is not currently on any blood thinners. And then she woke up this morning with blurred vision and a really bad headache. Pt is CAOx4 and in no acute distress at this time. Pt in wheelchair.

## 2023-01-31 NOTE — ED Provider Notes (Signed)
[facility] Provider Note   CSN: [id number] Arrival date & time: 01/31/23  1521     History  Chief Complaint  Patient presents with   Fall    Blurred vision/HA - happened yesterday   Blurred Vision   Headache    Samantha Dean is a 67 y.o. female presents to the emergency department valuation of a fall that occurred yesterday.  Patient states she tripped yesterday while she was pushing her mom in a wheelchair, she fell to her left side and injured herself.  She states she was fine yesterday uncertain if she hit her head but today has had a headache with muscle spasms in her back.  She denies any nausea vomiting or LOC.  She describes a anterior posterior dull aching headache with photophobia.  She has not any medications for headache or muscle spasms.  She denies any injury to her neck/lower back/hips or shoulders.  Patient has been ambulatory today, ambulation at baseline  HPI     Home Medications Prior to Admission medications   Medication Sig Start Date End Date Taking? Authorizing Provider  cyclobenzaprine (FLEXERIL) 5 MG tablet Take 1-2 tablets (5-10 mg total) by mouth 3 (three) times daily as needed for muscle spasms. 01/31/23  Yes Duanne Guess, PA-C  potassium chloride (KLOR-CON 10) 10 MEQ tablet Take 1 tablet (10 mEq total) by mouth daily for 5 days. 01/31/23 02/05/23 Yes Duanne Guess, PA-C  albuterol (VENTOLIN HFA) 108 (90 Base) MCG/ACT inhaler SMARTSIG:2 Inhalation Via Inhaler Every 6 Hours PRN 10/05/21   [provider]  alendronate (FOSAMAX) 70 MG tablet Take 70 mg by mouth once a week. Take with a full glass of water on an empty stomach.    [provider]  azelastine (ASTELIN) 0.1 % nasal spray Place into the nose. 03/19/18 03/19/19  [provider]  b complex vitamins tablet Take 1 tablet by mouth daily.    [provider]  Biotin 1 MG CAPS Take by mouth.    [provider]   butalbital-acetaminophen-caffeine (FIORICET) 50-325-40 MG tablet Take 1 tablet by mouth every 6 (six) hours as needed. 12/28/21   [provider]  Calcium Carbonate-Vit D-Min (CALCIUM 1200 PO) Take 2,400 mg by mouth daily.    [provider]  cyanocobalamin 1000 MCG tablet Take 1,000 mcg by mouth daily.    [provider]  doxycycline (PERIOSTAT) 20 MG tablet Take 1 tablet (20 mg total) by mouth 2 (two) times daily. with food but not with dairy. Stay upright for about 30 minutes after taking. 12/09/22 03/09/23  Moye, [place], MD  escitalopram (LEXAPRO) 20 MG tablet Take by mouth.    [provider]  fluconazole (DIFLUCAN) 150 MG tablet Take one tab po QD for one dose. Repeat in one week. 12/05/22   Moye, [place], MD  fluticasone (FLONASE) 50 MCG/ACT nasal spray Place into both nostrils daily.    [provider]  ketoconazole (NIZORAL) 2 % cream Apply to rash under breast QD PRN. 12/05/22   Moye, [place], MD  ketoconazole (NIZORAL) 2 % shampoo Shampoo into scalp let sit 5-10 minutes then wash out. Use 2-3 days per week. 12/05/22   Moye, [place], MD  loratadine (CLARITIN) 10 MG tablet Take 10 mg by mouth daily.    [provider]  meloxicam (MOBIC) 15 MG tablet  06/23/18   [provider]  metroNIDAZOLE (METROCREAM) 0.75 % cream APP THIN LAYER TO FACE BID 12/06/21  Moye, [place], MD  Minocycline HCl Micronized (ZILXI) 1.5 % FOAM For rosacea apply a thin coat to the face QD. 12/05/22   Moye, [place], MD  montelukast (SINGULAIR) 10 MG tablet Take by mouth. 09/28/20 09/28/21  [provider]  Multiple Vitamin (MULTIVITAMIN) capsule Take 1 capsule by mouth daily.    [provider]  Omega 3 340 MG CPDR Take by mouth daily.    [provider]  perphenazine (TRILAFON) 2 MG tablet Take by mouth. 04/16/16   [provider]  rosuvastatin (CRESTOR) 10 MG tablet Take 10 mg by mouth at bedtime. 11/26/21   [provider]  tretinoin (RETIN-A) 0.1 % cream Apply topically at bedtime. 12/05/22   Moye, [place], MD  Zinc Sulfate 66 MG TABS once daily 01/30/19   [provider]      Allergies    Oxcarbazepine, Cefuroxime, Citric acid, Eggs or egg-derived products, Flax [flax seed oil], Ginger, Milk (cow), Sulfa antibiotics, Wheat bran, Latex, and Other    Review of Systems   Review of Systems  Physical Exam Updated Vital Signs BP 135/78   Pulse 89   Temp 97.8 F (36.6 C) (Oral)   Resp 16   Ht 5' (1.524 m)   Wt 96.6 kg   SpO2 98%   BMI 41.60 kg/m  Physical Exam Constitutional:      Appearance: She is well-developed.  HENT:     Head: Normocephalic and atraumatic.  Eyes:     Conjunctiva/sclera: Conjunctivae normal.  Cardiovascular:     Rate and Rhythm: Normal rate.  Pulmonary:     Effort: Pulmonary effort is normal. No respiratory distress.  Abdominal:     Palpations: Abdomen is soft.  Musculoskeletal:        General: Normal range of motion.     Cervical back: Normal range of motion.     Comments: No cervical thoracic or lumbar spinous process tenderness.  No paravertebral muscle tenderness.  Mildly tender left and right paravertebral muscle soreness along the left and right lumbar spine.  Both hips move well with internal ex rotation with no discomfort  Skin:    General: Skin is warm.     Findings: No rash.  Neurological:     Mental Status: She is alert and oriented to person, place, and time. Mental status is at baseline.     GCS: GCS eye subscore is 4. GCS verbal subscore is 5. GCS motor subscore is 6.     Cranial Nerves: No cranial nerve deficit or facial asymmetry.     Sensory: No sensory deficit.     Motor: No weakness.     Coordination: Romberg sign negative.  Psychiatric:        Mood and Affect: Mood normal. Mood is not anxious.        Behavior: Behavior normal. Behavior is not agitated.        Thought Content: Thought content normal.     ED Results /  Procedures / Treatments   Labs (all labs ordered are listed, but only abnormal results are displayed) Labs Reviewed  BASIC METABOLIC PANEL - Abnormal; Notable for the following components:      Result Value   Potassium 3.2 (*)    Glucose, Bld 115 (*)    Calcium 8.5 (*)    All other components within normal limits  URINALYSIS, ROUTINE W REFLEX MICROSCOPIC - Abnormal; Notable for the following components:   Color, Urine COLORLESS (*)    APPearance CLEAR (*)  Specific Gravity, Urine 1.001 (*)    All other components within normal limits  CBG MONITORING, ED - Abnormal; Notable for the following components:   Glucose-Capillary 109 (*)    All other components within normal limits  CBC    EKG None  Radiology CT HEAD WO CONTRAST  Result Date: 01/31/2023 CLINICAL DATA:  Fall EXAM: CT HEAD WITHOUT CONTRAST TECHNIQUE: Contiguous axial images were obtained from the base of the skull through the vertex without intravenous contrast. RADIATION DOSE REDUCTION: This exam was performed according to the departmental dose-optimization program which includes automated exposure control, adjustment of the mA and/or kV according to patient size and/or use of iterative reconstruction technique. COMPARISON:  None Available. FINDINGS: Brain: No evidence of acute infarction, hemorrhage, hydrocephalus, extra-axial collection or mass lesion/mass effect. Vascular: No hyperdense vessel or unexpected calcification. Skull: Normal. Negative for fracture or focal lesion. No discernible soft tissue abnormality. Sinuses/Orbits: Trace right mastoid effusion. No middle ear effusion. Paranasal sinuses are clear. Orbits are unremarkable. Other: None. IMPRESSION: No CT evidence of intracranial injury. Electronically Signed   By: Marin Roberts M.D.   On: 01/31/2023 16:07    Procedures Procedures    Medications Ordered in ED Medications  acetaminophen (TYLENOL) tablet 1,000 mg (has no administration in time range)   potassium chloride SA (KLOR-CON M) CR tablet 10 mEq (has no administration in time range)    ED Course/ Medical Decision Making/ A&P                             Medical Decision Making Amount and/or Complexity of Data Reviewed Labs: ordered. Radiology: ordered.  Risk OTC drugs. Prescription drug management.   67 year old female with a fall yesterday.  She may have hit her head, having slight headache.  No LOC nausea or vomiting but does have slight photophobia.  Symptoms worse with activity.  She denies any spine discomfort but is having some paravertebral muscle spasms in her back.  She denies any abdominal pain, numbness or tingling in her upper or lower extremities.  CT scan ordered and independently reviewed by me today shows no evidence of acute intracranial abnormality.  She has normal CBC and UA.  BMP shows slightly low potassium of 3.2.  She is placed on a 5-day oral potassium supplement.  She will take Tylenol and ibuprofen for headache.  She is educated on concussion symptoms and at home treatment.  She understands signs symptoms return to the ER for.  Patient will take muscle relaxer as prescribed as needed for muscle spasms Final Clinical Impression(s) / ED Diagnoses Final diagnoses:  Fall, initial encounter  Concussion without loss of consciousness, initial encounter  Back muscle spasm  Hypokalemia    Rx / DC Orders ED Discharge Orders          Ordered    cyclobenzaprine (FLEXERIL) 5 MG tablet  3 times daily PRN        01/31/23 1701    potassium chloride (KLOR-CON 10) 10 MEQ tablet  Daily        01/31/23 1701              Renata Caprice 01/31/23 1706    Vanessa [place], MD 01/31/23 [phone]

## 2023-01-31 NOTE — Discharge Instructions (Addendum)
Please eat a diet high in potassium.  Take potassium supplement as prescribed for 5 days.  May take Tylenol and ibuprofen as needed for headache.  Avoid bright lights, screens, loud noises and physical activity that increases headache.  Return to the ER for any severe increasing headache, nausea vomiting or any worsening symptoms or any urgent changes in your health follow-up primary care provider in 1 week for recheck.

## 2023-01-31 NOTE — ED Triage Notes (Addendum)
First nurse note: Pt to ED via [facility]. Pt reports she had a fall yesterday. Pt endorses blurred vision, double vision and a frontal HA .

## 2023-02-06 DIAGNOSIS — Z6841 Body Mass Index (BMI) 40.0 and over, adult: Secondary | ICD-10-CM | POA: Diagnosis not present

## 2023-02-06 DIAGNOSIS — S060X0D Concussion without loss of consciousness, subsequent encounter: Secondary | ICD-10-CM | POA: Diagnosis not present

## 2023-02-19 DIAGNOSIS — J309 Allergic rhinitis, unspecified: Secondary | ICD-10-CM | POA: Diagnosis not present

## 2023-02-19 DIAGNOSIS — Z9109 Other allergy status, other than to drugs and biological substances: Secondary | ICD-10-CM | POA: Diagnosis not present

## 2023-02-19 DIAGNOSIS — Z760 Encounter for issue of repeat prescription: Secondary | ICD-10-CM | POA: Diagnosis not present

## 2023-02-19 DIAGNOSIS — J069 Acute upper respiratory infection, unspecified: Secondary | ICD-10-CM | POA: Diagnosis not present

## 2023-02-19 DIAGNOSIS — Z03818 Encounter for observation for suspected exposure to other biological agents ruled out: Secondary | ICD-10-CM | POA: Diagnosis not present

## 2023-02-21 DIAGNOSIS — F209 Schizophrenia, unspecified: Secondary | ICD-10-CM | POA: Diagnosis not present

## 2023-03-05 ENCOUNTER — Other Ambulatory Visit: Payer: Self-pay | Admitting: Dermatology

## 2023-03-05 DIAGNOSIS — R1011 Right upper quadrant pain: Secondary | ICD-10-CM | POA: Diagnosis not present

## 2023-03-06 ENCOUNTER — Other Ambulatory Visit: Payer: Self-pay | Admitting: Internal Medicine

## 2023-03-06 DIAGNOSIS — R1011 Right upper quadrant pain: Secondary | ICD-10-CM

## 2023-03-07 ENCOUNTER — Ambulatory Visit
Admission: RE | Admit: 2023-03-07 | Discharge: 2023-03-07 | Disposition: A | Discharge status: Home / Self Care | Payer: Medicare HMO | Source: Ambulatory Visit | Attending: Internal Medicine | Admitting: Internal Medicine

## 2023-03-07 DIAGNOSIS — R1011 Right upper quadrant pain: Secondary | ICD-10-CM | POA: Diagnosis not present

## 2023-03-07 DIAGNOSIS — K8020 Calculus of gallbladder without cholecystitis without obstruction: Secondary | ICD-10-CM | POA: Diagnosis not present

## 2023-03-13 DIAGNOSIS — R1011 Right upper quadrant pain: Secondary | ICD-10-CM | POA: Diagnosis not present

## 2023-03-21 DIAGNOSIS — Z03818 Encounter for observation for suspected exposure to other biological agents ruled out: Secondary | ICD-10-CM | POA: Diagnosis not present

## 2023-03-21 DIAGNOSIS — J0140 Acute pansinusitis, unspecified: Secondary | ICD-10-CM | POA: Diagnosis not present

## 2023-03-21 DIAGNOSIS — H6692 Otitis media, unspecified, left ear: Secondary | ICD-10-CM | POA: Diagnosis not present

## 2023-03-21 DIAGNOSIS — R21 Rash and other nonspecific skin eruption: Secondary | ICD-10-CM | POA: Diagnosis not present

## 2023-03-27 ENCOUNTER — Other Ambulatory Visit: Payer: Self-pay

## 2023-03-27 DIAGNOSIS — R1011 Right upper quadrant pain: Secondary | ICD-10-CM

## 2023-03-31 ENCOUNTER — Other Ambulatory Visit: Payer: Self-pay | Admitting: Internal Medicine

## 2023-03-31 DIAGNOSIS — Z1231 Encounter for screening mammogram for malignant neoplasm of breast: Secondary | ICD-10-CM

## 2023-04-02 ENCOUNTER — Ambulatory Visit
Admission: RE | Admit: 2023-04-02 | Discharge: 2023-04-02 | Disposition: A | Discharge status: Home / Self Care | Payer: Medicare HMO | Source: Ambulatory Visit | Attending: Internal Medicine | Admitting: Internal Medicine

## 2023-04-02 DIAGNOSIS — Z1231 Encounter for screening mammogram for malignant neoplasm of breast: Secondary | ICD-10-CM | POA: Diagnosis not present

## 2023-04-07 ENCOUNTER — Other Ambulatory Visit: Payer: Self-pay | Admitting: Internal Medicine

## 2023-04-07 DIAGNOSIS — N6489 Other specified disorders of breast: Secondary | ICD-10-CM

## 2023-04-07 DIAGNOSIS — R928 Other abnormal and inconclusive findings on diagnostic imaging of breast: Secondary | ICD-10-CM

## 2023-04-10 ENCOUNTER — Ambulatory Visit
Admission: RE | Admit: 2023-04-10 | Discharge: 2023-04-10 | Disposition: A | Discharge status: Home / Self Care | Payer: Medicare HMO | Source: Ambulatory Visit | Attending: Internal Medicine | Admitting: Internal Medicine

## 2023-04-10 DIAGNOSIS — R1011 Right upper quadrant pain: Secondary | ICD-10-CM | POA: Insufficient documentation

## 2023-04-10 DIAGNOSIS — R109 Unspecified abdominal pain: Secondary | ICD-10-CM | POA: Diagnosis not present

## 2023-04-10 HISTORY — DX: Unspecified asthma, uncomplicated: J45.909

## 2023-04-10 MED ORDER — IOHEXOL 350 MG/ML SOLN
100.0000 mL | Freq: Once | INTRAVENOUS | Status: AC | PRN
  Administered 2023-04-10: 100 mL via INTRAVENOUS

## 2023-04-11 ENCOUNTER — Ambulatory Visit
Admission: RE | Admit: 2023-04-11 | Discharge: 2023-04-11 | Disposition: A | Discharge status: Home / Self Care | Payer: Medicare HMO | Source: Ambulatory Visit | Attending: Internal Medicine | Admitting: Internal Medicine

## 2023-04-11 DIAGNOSIS — N6489 Other specified disorders of breast: Secondary | ICD-10-CM

## 2023-04-11 DIAGNOSIS — R928 Other abnormal and inconclusive findings on diagnostic imaging of breast: Secondary | ICD-10-CM

## 2023-04-28 DIAGNOSIS — R051 Acute cough: Secondary | ICD-10-CM | POA: Diagnosis not present

## 2023-04-28 DIAGNOSIS — J029 Acute pharyngitis, unspecified: Secondary | ICD-10-CM | POA: Diagnosis not present

## 2023-05-12 ENCOUNTER — Telehealth: Payer: Self-pay

## 2023-05-12 NOTE — Telephone Encounter (Signed)
I called patient to get her appointment rescheduled and she asked if she could get a rx for the Zilxi sent in.  Butch Penny., RMA

## 2023-05-14 ENCOUNTER — Other Ambulatory Visit: Payer: Self-pay

## 2023-05-14 DIAGNOSIS — L719 Rosacea, unspecified: Secondary | ICD-10-CM

## 2023-05-14 MED ORDER — ZILXI 1.5 % EX FOAM: CUTANEOUS | 3 refills | Status: DC

## 2023-05-14 NOTE — Telephone Encounter (Signed)
Yes. It looks like it was sent before, but find to send again. Thank you!

## 2023-05-14 NOTE — Telephone Encounter (Signed)
Refills sent to [facility], patient notified via MyChart, JS

## 2023-05-15 NOTE — Telephone Encounter (Signed)
We can try submitting for soolantra but I do not know if they will cover it. If not, I would recommend trying the skin medicinals triple cream (not covered by insurance) or retrying the metronidazole cream but doing a layer of argan oil at night to help hydrate the skin. Thank you!

## 2023-05-15 NOTE — Telephone Encounter (Signed)
[facility] called regarding patient's prescription. She has [employer] and not covered with no cash pay option at this time. aw

## 2023-05-19 NOTE — Telephone Encounter (Signed)
Left message for patient to return my call. aw 

## 2023-05-21 MED ORDER — IVERMECTIN 1 % EX CREA: 1.0000 | TOPICAL_CREAM | Freq: Every day | CUTANEOUS | 1 refills | Status: DC

## 2023-05-21 NOTE — Addendum Note (Signed)
Addended by: Dorathy Daft R on: 05/21/2023 12:56 PM   Modules accepted: Orders

## 2023-05-21 NOTE — Telephone Encounter (Signed)
Patient would like Soolantra to be sent in and PA attempted. RX sent in. aw

## 2023-05-23 DIAGNOSIS — F209 Schizophrenia, unspecified: Secondary | ICD-10-CM | POA: Diagnosis not present

## 2023-05-28 DIAGNOSIS — E282 Polycystic ovarian syndrome: Secondary | ICD-10-CM | POA: Diagnosis not present

## 2023-06-02 ENCOUNTER — Other Ambulatory Visit: Payer: Self-pay | Admitting: Dermatology

## 2023-06-04 DIAGNOSIS — Z0000 Encounter for general adult medical examination without abnormal findings: Secondary | ICD-10-CM | POA: Diagnosis not present

## 2023-06-04 DIAGNOSIS — Z1331 Encounter for screening for depression: Secondary | ICD-10-CM | POA: Diagnosis not present

## 2023-06-04 DIAGNOSIS — E282 Polycystic ovarian syndrome: Secondary | ICD-10-CM | POA: Diagnosis not present

## 2023-06-04 DIAGNOSIS — R1011 Right upper quadrant pain: Secondary | ICD-10-CM | POA: Diagnosis not present

## 2023-06-04 DIAGNOSIS — F250 Schizoaffective disorder, bipolar type: Secondary | ICD-10-CM | POA: Diagnosis not present

## 2023-06-11 ENCOUNTER — Ambulatory Visit: Payer: Medicare HMO | Admitting: Dermatology

## 2023-07-18 DIAGNOSIS — J069 Acute upper respiratory infection, unspecified: Secondary | ICD-10-CM | POA: Diagnosis not present

## 2023-07-18 DIAGNOSIS — M7552 Bursitis of left shoulder: Secondary | ICD-10-CM | POA: Diagnosis not present

## 2023-08-08 DIAGNOSIS — J329 Chronic sinusitis, unspecified: Secondary | ICD-10-CM | POA: Diagnosis not present

## 2023-08-12 ENCOUNTER — Encounter: Payer: Self-pay | Admitting: Dermatology

## 2023-08-12 ENCOUNTER — Ambulatory Visit: Payer: Medicare HMO | Admitting: Dermatology

## 2023-08-12 ENCOUNTER — Other Ambulatory Visit: Payer: Self-pay | Admitting: Dermatology

## 2023-08-12 VITALS — BP 152/83

## 2023-08-12 DIAGNOSIS — L719 Rosacea, unspecified: Secondary | ICD-10-CM | POA: Diagnosis not present

## 2023-08-12 DIAGNOSIS — L700 Acne vulgaris: Secondary | ICD-10-CM | POA: Diagnosis not present

## 2023-08-12 DIAGNOSIS — L409 Psoriasis, unspecified: Secondary | ICD-10-CM

## 2023-08-12 MED ORDER — VTAMA 1 % EX CREA: 1.0000 | TOPICAL_CREAM | Freq: Every day | CUTANEOUS | 1 refills | Status: AC

## 2023-08-12 MED ORDER — ZILXI 1.5 % EX FOAM: CUTANEOUS | 3 refills | Status: DC

## 2023-08-12 MED ORDER — MINOCYCLINE HCL MICRONIZED 1.5 % EX FOAM: CUTANEOUS | 5 refills | Status: DC

## 2023-08-12 MED ORDER — DOXYCYCLINE HYCLATE 20 MG PO TABS: 20.0000 mg | ORAL_TABLET | Freq: Every day | ORAL | 2 refills | Status: DC

## 2023-08-12 NOTE — Patient Instructions (Addendum)
Continue Metronidazole cream. Once you receive the Minocycline cream you will discontinue the Metro cream.    Due to recent changes in healthcare laws, you may see results of your pathology and/or laboratory studies on MyChart before the doctors have had a chance to review them. We understand that in some cases there may be results that are confusing or concerning to you. Please understand that not all results are received at the same time and often the doctors may need to interpret multiple results in order to provide you with the best plan of care or course of treatment. Therefore, we ask that you please give [place] 2 business days to thoroughly review all your results before contacting the office for clarification. Should we see a critical lab result, you will be contacted sooner.   If You Need Anything After Your Visit  If you have any questions or concerns for your doctor, please call our main line at [phone] and press option 4 to reach your doctor's medical assistant. If no one answers, please leave a voicemail as directed and we will return your call as soon as possible. Messages left after 4 pm will be answered the following business day.   You may also send [place] a message via MyChart. We typically respond to MyChart messages within 1-2 business days.  For prescription refills, please ask your pharmacy to contact our office. Our fax number is [phone].  If you have an urgent issue when the clinic is closed that cannot wait until the next business day, you can page your doctor at the number below.    Please note that while we do our best to be available for urgent issues outside of office hours, we are not available 24/7.   If you have an urgent issue and are unable to reach [place], you may choose to seek medical care at your doctor's office, retail clinic, urgent care center, or emergency room.  If you have a medical emergency, please immediately call 911 or go to the emergency  department.  Pager Numbers  - Dr. Gwen Pounds: [phone]  - Dr. Roseanne Reno: [phone]  - Dr. Katrinka Blazing: [phone]   In the event of inclement weather, please call our main line at [phone] for an update on the status of any delays or closures.  Dermatology Medication Tips: Please keep the boxes that topical medications come in in order to help keep track of the instructions about where and how to use these. Pharmacies typically print the medication instructions only on the boxes and not directly on the medication tubes.   If your medication is too expensive, please contact our office at [phone] option 4 or send [place] a message through MyChart.   We are unable to tell what your co-pay for medications will be in advance as this is different depending on your insurance coverage. However, we may be able to find a substitute medication at lower cost or fill out paperwork to get insurance to cover a needed medication.   If a prior authorization is required to get your medication covered by your insurance company, please allow [place] 1-2 business days to complete this process.  Drug prices often vary depending on where the prescription is filled and some pharmacies may offer cheaper prices.  The website www.goodrx.com contains coupons for medications through different pharmacies. The prices here do not account for what the cost may be with help from insurance (it may be cheaper with your insurance), but the website can give you the price  if you did not use any insurance.  - You can print the associated coupon and take it with your prescription to the pharmacy.  - You may also stop by our office during regular business hours and pick up a GoodRx coupon card.  - If you need your prescription sent electronically to a different pharmacy, notify our office through [facility] or by phone at [phone] option 4.

## 2023-08-12 NOTE — Progress Notes (Signed)
Follow-Up Visit   Subjective  Samantha Dean is a 67 y.o. female who presents for the following: Follow up  Patient was seen on 12/05/22. The following conditions were discussed: Seborrheic Dermatitis Psoriasis, Acne, Rosacea, Ostemoma Cutis, Candidal intertrigo.   Samantha Dean is still using tretinoin once or twice a week for the acne and the Metronidazole for rosacea. She has progressed but can't use it more then twice a week without experiencing burning sensation. Samantha Dean is using sunscreen as well as cleansing and moisturizing. When her scalp flares from the seb derm, she using the Ketoconazole shampoo which helps. Vtama was given for Psoriasis on the L Elbow which helped but patient is out and flaring. The yeast on the lower abdomen has cleared since last visit.   Patient complains of wrinkles. She states she has switched makeup products and notices a difference.    The following portions of the chart were reviewed this encounter and updated as appropriate: medications, allergies, medical history  Review of Systems:  No other skin or systemic complaints except as noted in HPI or Assessment and Plan.  Objective  Well appearing patient in no apparent distress; mood and affect are within normal limits.  A focused examination was performed of the following areas: Face and L Elbow  Relevant exam findings are noted in the Assessment and Plan.    Assessment & Plan  ACNE VULGARIS, chronic, improved with treatment, at patient goal Exam: no active lesions. 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 Advised to take note of the use of Aquaphor as it can clog pores. Continue cleansing and moisturizing. Will also help with rhytides. Sun protection on face for rosacea as below and to minimize rhytides  ROSACEA, chronic, improved with treatment, at patient goal Exam Mid face erythema with telangiectasias +/- scattered inflammatory papules   Rosacea is a chronic  progressive skin condition usually affecting the face of adults, causing redness and/or acne bumps. It is treatable but not curable. It sometimes affects the eyes (ocular rosacea) as well. It may respond to topical and/or systemic medication and can flare with stress, sun exposure, alcohol, exercise, topical steroids (including hydrocortisone/cortisone 10) and some foods.  Daily application of broad spectrum spf 30+ sunscreen to face is recommended to reduce flares.   Treatment Plan: Continue Metronidazole cream BID as tolerated (burns with consecutive use sometimes). Sent prescription of Minocycline for use once daily to [facility]. [facility] and they stated minocycline foam is nonformulary and will be very expensive. Sent mychart message asking if patient wants [place] minocycline foam (5153 30 ml) Refill of Doxycycline 20 mg daily. Take one tablet daily with food.    Rosacea  Related Medications Minocycline HCl Micronized (ZILXI) 1.5 % FOAM For rosacea apply a thin coat to the face QD.  Acne vulgaris  Related Medications tretinoin (RETIN-A) 0.1 % cream Apply topically at bedtime.  PSORIASIS, chronic, improves with vtama but flares off it, flaring today and not at patient goal Exam: Well-demarcated erythematous plaque with silvery scale on left extensor elbow. No lesions on right side Not at goal  patient denies joint pain  Psoriasis is a chronic non-curable, but treatable genetic/hereditary disease that may have other systemic features affecting other organ systems such as joints (Psoriatic Arthritis). It is associated with an increased risk of inflammatory bowel disease, heart disease, non-alcoholic fatty liver disease, and depression.  Treatments include light and laser treatments; topical medications; and systemic medications including oral and injectables.  Treatment Plan: More samples of Vtama given today.  Samantha Dean for use once daily on lesion until clear,  sent to [facility] and they stated Samantha Dean is nonformulary and will be very expensive. Working with Samantha Dean drug rep to get covered. Samantha Dean is sending prior authorization Agree that chronic rubbing can exacerbate lesion (koebnerization)  INTERTRIGO Cleared with treatment per patient  Return in about 6 months (around 02/12/2024).  I, Germaine Pomfret, CMA, am acting as scribe for Elie Goody, MD.   Documentation: I have reviewed the above documentation for accuracy and completeness, and I agree with the above.  Elie Goody, MD

## 2023-08-19 DIAGNOSIS — F209 Schizophrenia, unspecified: Secondary | ICD-10-CM | POA: Diagnosis not present

## 2023-09-02 ENCOUNTER — Other Ambulatory Visit: Payer: Self-pay | Admitting: Dermatology

## 2023-09-12 DIAGNOSIS — R918 Other nonspecific abnormal finding of lung field: Secondary | ICD-10-CM | POA: Diagnosis not present

## 2023-09-12 DIAGNOSIS — R21 Rash and other nonspecific skin eruption: Secondary | ICD-10-CM | POA: Diagnosis not present

## 2023-09-12 DIAGNOSIS — J029 Acute pharyngitis, unspecified: Secondary | ICD-10-CM | POA: Diagnosis not present

## 2023-09-12 DIAGNOSIS — I517 Cardiomegaly: Secondary | ICD-10-CM | POA: Diagnosis not present

## 2023-09-12 DIAGNOSIS — Z03818 Encounter for observation for suspected exposure to other biological agents ruled out: Secondary | ICD-10-CM | POA: Diagnosis not present

## 2023-09-12 DIAGNOSIS — R0602 Shortness of breath: Secondary | ICD-10-CM | POA: Diagnosis not present

## 2023-10-01 DIAGNOSIS — R10811 Right upper quadrant abdominal tenderness: Secondary | ICD-10-CM | POA: Diagnosis not present

## 2023-10-01 DIAGNOSIS — R10819 Abdominal tenderness, unspecified site: Secondary | ICD-10-CM | POA: Diagnosis not present

## 2023-10-03 ENCOUNTER — Other Ambulatory Visit: Payer: Self-pay | Admitting: Internal Medicine

## 2023-10-03 DIAGNOSIS — R10811 Right upper quadrant abdominal tenderness: Secondary | ICD-10-CM

## 2023-10-03 DIAGNOSIS — M549 Dorsalgia, unspecified: Secondary | ICD-10-CM | POA: Diagnosis not present

## 2023-10-17 ENCOUNTER — Encounter
Admission: RE | Admit: 2023-10-17 | Discharge: 2023-10-17 | Disposition: A | Discharge status: Home / Self Care | Payer: Medicare HMO | Source: Ambulatory Visit | Attending: Internal Medicine

## 2023-10-17 DIAGNOSIS — R10811 Right upper quadrant abdominal tenderness: Secondary | ICD-10-CM | POA: Insufficient documentation

## 2023-10-17 DIAGNOSIS — R1011 Right upper quadrant pain: Secondary | ICD-10-CM | POA: Diagnosis not present

## 2023-10-17 MED ORDER — TECHNETIUM TC 99M MEBROFENIN IV KIT
5.2000 | PACK | Freq: Once | INTRAVENOUS | Status: AC | PRN
  Administered 2023-10-17: 5.2 via INTRAVENOUS

## 2023-10-27 DIAGNOSIS — J029 Acute pharyngitis, unspecified: Secondary | ICD-10-CM | POA: Diagnosis not present

## 2023-10-27 DIAGNOSIS — Z03818 Encounter for observation for suspected exposure to other biological agents ruled out: Secondary | ICD-10-CM | POA: Diagnosis not present

## 2023-11-25 DIAGNOSIS — F209 Schizophrenia, unspecified: Secondary | ICD-10-CM | POA: Diagnosis not present

## 2023-12-02 ENCOUNTER — Other Ambulatory Visit: Payer: Self-pay | Admitting: Dermatology

## 2023-12-02 DIAGNOSIS — L219 Seborrheic dermatitis, unspecified: Secondary | ICD-10-CM

## 2023-12-03 DIAGNOSIS — F250 Schizoaffective disorder, bipolar type: Secondary | ICD-10-CM | POA: Diagnosis not present

## 2023-12-08 ENCOUNTER — Other Ambulatory Visit: Payer: Self-pay | Admitting: Dermatology

## 2023-12-10 DIAGNOSIS — Z6841 Body Mass Index (BMI) 40.0 and over, adult: Secondary | ICD-10-CM | POA: Diagnosis not present

## 2023-12-10 DIAGNOSIS — F250 Schizoaffective disorder, bipolar type: Secondary | ICD-10-CM | POA: Diagnosis not present

## 2023-12-10 DIAGNOSIS — E282 Polycystic ovarian syndrome: Secondary | ICD-10-CM | POA: Diagnosis not present

## 2023-12-10 DIAGNOSIS — Z0001 Encounter for general adult medical examination with abnormal findings: Secondary | ICD-10-CM | POA: Diagnosis not present

## 2023-12-10 DIAGNOSIS — E669 Obesity, unspecified: Secondary | ICD-10-CM | POA: Diagnosis not present

## 2024-01-22 DIAGNOSIS — F250 Schizoaffective disorder, bipolar type: Secondary | ICD-10-CM | POA: Diagnosis not present

## 2024-01-22 DIAGNOSIS — J019 Acute sinusitis, unspecified: Secondary | ICD-10-CM | POA: Diagnosis not present

## 2024-01-22 DIAGNOSIS — B3789 Other sites of candidiasis: Secondary | ICD-10-CM | POA: Diagnosis not present

## 2024-02-16 ENCOUNTER — Encounter: Payer: Self-pay | Admitting: Dermatology

## 2024-02-16 ENCOUNTER — Ambulatory Visit (INDEPENDENT_AMBULATORY_CARE_PROVIDER_SITE_OTHER): Payer: Medicare HMO | Admitting: Dermatology

## 2024-02-16 DIAGNOSIS — L719 Rosacea, unspecified: Secondary | ICD-10-CM | POA: Diagnosis not present

## 2024-02-16 DIAGNOSIS — L280 Lichen simplex chronicus: Secondary | ICD-10-CM

## 2024-02-16 DIAGNOSIS — L304 Erythema intertrigo: Secondary | ICD-10-CM

## 2024-02-16 DIAGNOSIS — L700 Acne vulgaris: Secondary | ICD-10-CM

## 2024-02-16 DIAGNOSIS — L409 Psoriasis, unspecified: Secondary | ICD-10-CM

## 2024-02-16 MED ORDER — HYDROCORTISONE 2.5 % EX CREA: TOPICAL_CREAM | Freq: Two times a day (BID) | CUTANEOUS | 11 refills | Status: AC | PRN

## 2024-02-16 MED ORDER — TRETINOIN 0.1 % EX CREA: TOPICAL_CREAM | Freq: Every day | CUTANEOUS | 5 refills | Status: DC

## 2024-02-16 MED ORDER — METRONIDAZOLE 0.75 % EX CREA: TOPICAL_CREAM | CUTANEOUS | 5 refills | Status: DC

## 2024-02-16 MED ORDER — DOXYCYCLINE HYCLATE 20 MG PO TABS: ORAL_TABLET | ORAL | 2 refills | Status: DC

## 2024-02-16 NOTE — Patient Instructions (Signed)

## 2024-02-16 NOTE — Progress Notes (Signed)
Follow-Up Visit   Subjective  Samantha Dean is a 68 y.o. female who presents for the following: Follow up: Acne, Rosacea, Psoriasis Acne: tretinoin working well for pt, pt also using CeraVe oils for body, and pomegranate oil for face she got from [place] and has helped clear face.  Rosacea: not using minocycline foam, felt like it was not helping. Pt still taking doxy 20mg  daily and does help.   Psoriasis: pt using Vtama, pt states her insurance denied it, still flaring at L elbow, Vtama helps with roughness, but still red.  Pt also has rash at chest and groin area appeared about 6 weeks ago itchy, using nystatin for possible yeast on chest and bilateral groin, inframammary prescribed by PCP.  The patient has spots, moles and lesions to be evaluated, some may be new or changing and the patient may have concern these could be cancer.   The following portions of the chart were reviewed this encounter and updated as appropriate: medications, allergies, medical history  Review of Systems:  No other skin or systemic complaints except as noted in HPI or Assessment and Plan.  Objective  Well appearing patient in no apparent distress; mood and affect are within normal limits.   A focused examination was performed of the following areas: Chest, elbows, face, inframammary   Relevant exam findings are noted in the Assessment and Plan.      Assessment & Plan   ACNE VULGARIS, chronic, improved with treatment, at patient goal Exam: no active lesions. 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   ROSACEA, chronic, improved with treatment, at patient goal Exam Mid face erythema with telangiectasias     Rosacea is a chronic progressive skin condition usually affecting the face of adults, causing redness and/or acne bumps. It is treatable but not curable. It sometimes affects the eyes (ocular rosacea) as well. It may respond to topical and/or  systemic medication and can flare with stress, sun exposure, alcohol, exercise, topical steroids (including hydrocortisone/cortisone 10) and some foods.  Daily application of broad spectrum spf 30+ sunscreen to face is recommended to reduce flares.     Treatment Plan: - Continue Metronidazole cream BID as tolerated. - Continue Doxycycline 20 mg daily. Take one tablet daily with food.   Doxycycline should be taken with food to prevent nausea. Do not lay down for 30 minutes after taking. Be cautious with sun exposure and use good sun protection while on this medication. Pregnant women should not take this medication.      PSORIASIS/LSC, chronic, improves with vtama but flares off it, flaring today and not at patient goal Exam: Well-demarcated erythematous plaque with silvery scale on left extensor elbow. No lesions on right side Not at goal   Psoriasis is a chronic non-curable, but treatable genetic/hereditary disease that may have other systemic features affecting other organ systems such as joints (Psoriatic Arthritis). It is associated with an increased risk of inflammatory bowel disease, heart disease, non-alcoholic fatty liver disease, and depression.  Treatments include light and laser treatments; topical medications; and systemic medications including oral and injectables.   Treatment Plan: Continue Vtama 1% cream. Apply to aa, elbow daily.  More samples of Vtama x3 given to patient today.   Lot: [id number] NDC: [address]-8 Exp: 02/2024    INTERTRIGO Exam: Erythematous macerated patches in inframammary. Patient reports lesions in groin too  Chronic and persistent condition with duration or expected duration over one year. Condition is bothersome/symptomatic for  patient. Currently flared.   Intertrigo is a chronic recurrent rash that occurs in skin fold areas that may be associated with friction; heat; moisture; yeast; fungus; and bacteria.  It is exacerbated by increased movement /  activity; sweating; and higher atmospheric temperature.  Use of an absorbant powder such as Zeasorb AF powder or other OTC antifungal powder to the area daily can prevent rash recurrence. Other options to help keep the area dry include blow drying the area after bathing or using antiperspirant products such as Duradry sweat minimizing gel.  Treatment Plan: Start HC 2.5% use BID to aa, once resolved can discontinue. Continue nystatin  Topical steroids (such as triamcinolone, fluocinolone, fluocinonide, mometasone, clobetasol, halobetasol, betamethasone, hydrocortisone) can cause thinning and lightening of the skin if they are used for too long in the same area. Your physician has selected the right strength medicine for your problem and area affected on the body. Please use your medication only as directed by your physician to prevent side effects.     ACNE VULGARIS   Related Medications tretinoin (RETIN-A) 0.1 % cream Apply topically at bedtime. ROSACEA   Related Medications Minocycline HCl Micronized 1.5 % FOAM Apply to rosacea once per day PSORIASIS   INTERTRIGO   LSC (LICHEN SIMPLEX CHRONICUS)    Return in about 6 months (around 08/15/2024) for w/ Dr. Katrinka Blazing, Acne, Psoriasis, Rosacea.  Wynonia Lawman, CMA, am acting as scribe for Elie Goody, MD .   Documentation: I have reviewed the above documentation for accuracy and completeness, and I agree with the above.  Elie Goody, MD

## 2024-02-24 DIAGNOSIS — Z03818 Encounter for observation for suspected exposure to other biological agents ruled out: Secondary | ICD-10-CM | POA: Diagnosis not present

## 2024-02-24 DIAGNOSIS — J019 Acute sinusitis, unspecified: Secondary | ICD-10-CM | POA: Diagnosis not present

## 2024-02-24 DIAGNOSIS — J45909 Unspecified asthma, uncomplicated: Secondary | ICD-10-CM | POA: Diagnosis not present

## 2024-03-02 ENCOUNTER — Other Ambulatory Visit: Payer: Self-pay

## 2024-03-02 MED ORDER — DOXYCYCLINE HYCLATE 20 MG PO TABS: ORAL_TABLET | ORAL | 1 refills | Status: DC

## 2024-03-02 NOTE — Telephone Encounter (Signed)
 Patient called her Doxycycline was not sent to [facility], she was here 02/16/24   [facility] Doxycycline 20 mg #30 1 rf sent to [facility]

## 2024-03-04 DIAGNOSIS — F200 Paranoid schizophrenia: Secondary | ICD-10-CM | POA: Diagnosis not present

## 2024-03-15 ENCOUNTER — Other Ambulatory Visit: Payer: Self-pay

## 2024-03-15 MED ORDER — DOXYCYCLINE HYCLATE 20 MG PO TABS: ORAL_TABLET | ORAL | 1 refills | Status: DC

## 2024-03-15 NOTE — Progress Notes (Signed)
 Doxycycline to Pharmacy. aw

## 2024-03-16 ENCOUNTER — Telehealth: Payer: Self-pay

## 2024-03-16 MED ORDER — DOXYCYCLINE HYCLATE 20 MG PO TABS: 20.0000 mg | ORAL_TABLET | Freq: Every day | ORAL | 0 refills | Status: AC

## 2024-03-16 NOTE — Telephone Encounter (Signed)
 Patient called in yesterday for refill and clarification of Doxycycline. She states she has been taking this BID but confirmed with Dr. Katrinka Blazing prescription is only once daily. I have left message with patient to return my call. I have also called pharmacy and cancelled BID prescription.   Fresh prescription of once daily sent in. aw

## 2024-03-25 DIAGNOSIS — J029 Acute pharyngitis, unspecified: Secondary | ICD-10-CM | POA: Diagnosis not present

## 2024-03-25 DIAGNOSIS — Z9109 Other allergy status, other than to drugs and biological substances: Secondary | ICD-10-CM | POA: Diagnosis not present

## 2024-04-01 ENCOUNTER — Other Ambulatory Visit: Payer: Self-pay | Admitting: Internal Medicine

## 2024-04-01 DIAGNOSIS — Z1231 Encounter for screening mammogram for malignant neoplasm of breast: Secondary | ICD-10-CM

## 2024-04-13 ENCOUNTER — Ambulatory Visit
Admission: RE | Admit: 2024-04-13 | Discharge: 2024-04-13 | Disposition: A | Discharge status: Home / Self Care | Source: Ambulatory Visit | Attending: Internal Medicine | Admitting: Internal Medicine

## 2024-04-13 DIAGNOSIS — Z1231 Encounter for screening mammogram for malignant neoplasm of breast: Secondary | ICD-10-CM | POA: Insufficient documentation

## 2024-04-21 DIAGNOSIS — J301 Allergic rhinitis due to pollen: Secondary | ICD-10-CM | POA: Diagnosis not present

## 2024-04-29 ENCOUNTER — Telehealth: Payer: Self-pay

## 2024-04-29 MED ORDER — DOXYCYCLINE HYCLATE 20 MG PO TABS: ORAL_TABLET | ORAL | 0 refills | Status: DC

## 2024-04-29 NOTE — Telephone Encounter (Signed)
 Patient states that Doxycycline  prescription was sent for QD, and she is running out of medication early every month because she was prescribed BID. New prescription sent to [facility] Lee.

## 2024-05-27 DIAGNOSIS — F200 Paranoid schizophrenia: Secondary | ICD-10-CM | POA: Diagnosis not present

## 2024-07-26 ENCOUNTER — Other Ambulatory Visit: Payer: Self-pay | Admitting: Dermatology

## 2024-08-03 DIAGNOSIS — F200 Paranoid schizophrenia: Secondary | ICD-10-CM | POA: Diagnosis not present

## 2024-08-04 DIAGNOSIS — H2513 Age-related nuclear cataract, bilateral: Secondary | ICD-10-CM | POA: Diagnosis not present

## 2024-08-13 DIAGNOSIS — Z0100 Encounter for examination of eyes and vision without abnormal findings: Secondary | ICD-10-CM | POA: Diagnosis not present

## 2024-08-13 DIAGNOSIS — H2513 Age-related nuclear cataract, bilateral: Secondary | ICD-10-CM | POA: Diagnosis not present

## 2024-08-16 ENCOUNTER — Ambulatory Visit (INDEPENDENT_AMBULATORY_CARE_PROVIDER_SITE_OTHER): Payer: Medicare HMO | Admitting: Dermatology

## 2024-08-16 ENCOUNTER — Encounter: Payer: Self-pay | Admitting: Dermatology

## 2024-08-16 DIAGNOSIS — L719 Rosacea, unspecified: Secondary | ICD-10-CM | POA: Diagnosis not present

## 2024-08-16 DIAGNOSIS — L700 Acne vulgaris: Secondary | ICD-10-CM | POA: Diagnosis not present

## 2024-08-16 DIAGNOSIS — L918 Other hypertrophic disorders of the skin: Secondary | ICD-10-CM

## 2024-08-16 DIAGNOSIS — L280 Lichen simplex chronicus: Secondary | ICD-10-CM

## 2024-08-16 DIAGNOSIS — L821 Other seborrheic keratosis: Secondary | ICD-10-CM | POA: Diagnosis not present

## 2024-08-16 MED ORDER — DOXYCYCLINE HYCLATE 20 MG PO TABS: ORAL_TABLET | ORAL | 6 refills | Status: AC

## 2024-08-16 MED ORDER — TRETINOIN 0.1 % EX CREA: TOPICAL_CREAM | CUTANEOUS | 6 refills | Status: AC

## 2024-08-16 MED ORDER — METRONIDAZOLE 0.75 % EX CREA: TOPICAL_CREAM | CUTANEOUS | 6 refills | Status: AC

## 2024-08-16 NOTE — Patient Instructions (Addendum)
Doxycycline should be taken with food to prevent nausea. Do not lay down for 30 minutes after taking. Be cautious with sun exposure and use good sun protection while on this medication. Pregnant women should not take this medication.   Due to recent changes in healthcare laws, you may see results of your pathology and/or laboratory studies on MyChart before the doctors have had a chance to review them. We understand that in some cases there may be results that are confusing or concerning to you. Please understand that not all results are received at the same time and often the doctors may need to interpret multiple results in order to provide you with the best plan of care or course of treatment. Therefore, we ask that you please give [place] 2 business days to thoroughly review all your results before contacting the office for clarification. Should we see a critical lab result, you will be contacted sooner.   If You Need Anything After Your Visit  If you have any questions or concerns for your doctor, please call our main line at [phone] and press option 4 to reach your doctor's medical assistant. If no one answers, please leave a voicemail as directed and we will return your call as soon as possible. Messages left after 4 pm will be answered the following business day.   You may also send [place] a message via MyChart. We typically respond to MyChart messages within 1-2 business days.  For prescription refills, please ask your pharmacy to contact our office. Our fax number is [phone].  If you have an urgent issue when the clinic is closed that cannot wait until the next business day, you can page your doctor at the number below.    Please note that while we do our best to be available for urgent issues outside of office hours, we are not available 24/7.   If you have an urgent issue and are unable to reach [place], you may choose to seek medical care at your doctor's office, retail clinic, urgent care  center, or emergency room.  If you have a medical emergency, please immediately call 911 or go to the emergency department.  Pager Numbers  - Dr. Gwen Pounds: [phone]  - Dr. Roseanne Reno: [phone]  - Dr. Katrinka Blazing: [phone]   In the event of inclement weather, please call our main line at [phone] for an update on the status of any delays or closures.  Dermatology Medication Tips: Please keep the boxes that topical medications come in in order to help keep track of the instructions about where and how to use these. Pharmacies typically print the medication instructions only on the boxes and not directly on the medication tubes.   If your medication is too expensive, please contact our office at [phone] option 4 or send [place] a message through MyChart.   We are unable to tell what your co-pay for medications will be in advance as this is different depending on your insurance coverage. However, we may be able to find a substitute medication at lower cost or fill out paperwork to get insurance to cover a needed medication.   If a prior authorization is required to get your medication covered by your insurance company, please allow [place] 1-2 business days to complete this process.  Drug prices often vary depending on where the prescription is filled and some pharmacies may offer cheaper prices.  The website www.goodrx.com contains coupons for medications through different pharmacies. The prices here do not account for what  the cost may be with help from insurance (it may be cheaper with your insurance), but the website can give you the price if you did not use any insurance.  - You can print the associated coupon and take it with your prescription to the pharmacy.  - You may also stop by our office during regular business hours and pick up a GoodRx coupon card.  - If you need your prescription sent electronically to a different pharmacy, notify our office through [facility] or by  phone at [phone] option 4.     Si Usted Necesita Algo Despus de Su Visita  Tambin puede enviarnos un mensaje a travs de Clinical cytogeneticist. Por lo general respondemos a los mensajes de MyChart en el transcurso de 1 a 2 das hbiles.  Para renovar recetas, por favor pida a su farmacia que se ponga en contacto con nuestra oficina. Annie Sable de fax es [place] [phone].  Si tiene un asunto urgente cuando la clnica est cerrada y que no puede esperar hasta el siguiente da hbil, puede llamar/localizar a su doctor(a) al nmero que aparece a continuacin.   Por favor, tenga en cuenta que aunque hacemos todo lo posible para estar disponibles para asuntos urgentes fuera del horario de [place], no estamos disponibles las 24 horas del da, los 7 [address]  [address] de la [place].   Si tiene un problema urgente y no puede comunicarse con nosotros, puede optar por buscar atencin mdica  en el consultorio de su doctor(a), en una clnica privada, en un centro de atencin urgente o en una sala de emergencias.  Si tiene Engineer, drilling, por favor llame inmediatamente al 911 o vaya a la sala de emergencias.  Nmeros de bper  - Dr. Gwen Pounds: [phone]  - Dra. Roseanne Reno: [phone]  - Dr. Katrinka Blazing: [phone]   En caso de inclemencias del tiempo, por favor llame a Lacy Duverney principal al [phone] para una actualizacin sobre el [place] de cualquier retraso o cierre.  Consejos para la medicacin en dermatologa: Por favor, guarde las cajas en las que vienen los medicamentos de uso tpico para ayudarle a seguir las instrucciones sobre dnde y cmo usarlos. Las farmacias generalmente imprimen las instrucciones del medicamento slo en las cajas y no directamente en los tubos del [place].   Si su medicamento es muy caro, por favor, pngase en contacto con Rolm Gala llamando al [phone] y presione la opcin 4 o envenos un mensaje a travs de Clinical cytogeneticist.   No podemos decirle cul ser su copago  por los medicamentos por adelantado ya que esto es diferente dependiendo de la cobertura de su seguro. Sin embargo, es posible que podamos encontrar un medicamento sustituto a Audiological scientist un formulario para que el seguro cubra el medicamento que se considera necesario.   Si se requiere una autorizacin previa para que su compaa de seguros [place] su medicamento, por favor permtanos de 1 a 2 das hbiles para completar [address]  Los precios de los medicamentos varan con frecuencia dependiendo del Environmental consultant de dnde se surte la receta y alguna farmacias pueden ofrecer precios ms baratos.  El sitio web www.goodrx.com tiene cupones para medicamentos de Health and safety inspector. Los precios aqu no tienen en cuenta lo que podra costar con la ayuda del seguro (puede ser ms barato con su seguro), pero el sitio web puede darle el precio si no utiliz Tourist information centre manager.  - Puede imprimir el cupn correspondiente y llevarlo con su receta a la farmacia.  - Tambin puede  pasar por nuestra oficina durante el horario de atencin regular y Education officer, museum una tarjeta de cupones de GoodRx.  - Si necesita que su receta se enve electrnicamente a una farmacia diferente, informe a nuestra oficina a travs de MyChart de [place] o por telfono llamando al [phone] y presione la opcin 4.

## 2024-08-16 NOTE — Progress Notes (Signed)
 Follow-Up Visit   Subjective  Samantha Dean is a 68 y.o. female who presents for the following: 6 months f/u on Acne, Rosacea, Psoriasis Acne treating w tretinoin  working well for pt,  Rosacea treating with Metronidazole  cream and Doxycycline  with a good response Psoriasis treating with Vtama  cream with a good response  Patient c/o itchy spots on her chest, using otc Hydrocortisone  cream.    Patient report she recently loss her father.    The following portions of the chart were reviewed this encounter and updated as appropriate: medications, allergies, medical history  Review of Systems:  No other skin or systemic complaints except as noted in HPI or Assessment and Plan.  Objective  Well appearing patient in no apparent distress; mood and affect are within normal limits.  A focused examination was performed of the following areas: Face,chest,arms,  Relevant exam findings are noted in the Assessment and Plan.    Assessment & Plan   ROSACEA, chronic, improved with treatment, at patient goal Exam Mid face erythema with telangiectasias     Rosacea is a chronic progressive skin condition usually affecting the face of adults, causing redness and/or acne bumps. It is treatable but not curable. It sometimes affects the eyes (ocular rosacea) as well. It may respond to topical and/or systemic medication and can flare with stress, sun exposure, alcohol , exercise, topical steroids (including hydrocortisone /cortisone 10) and some foods.  Daily application of broad spectrum spf 30+ sunscreen to face is recommended to reduce flares.     Treatment Plan: - Continue Metronidazole  cream BID as tolerated. - Continue Doxycycline  20 mg daily. Take one tablet daily with food.    Doxycycline  should be taken with food to prevent nausea. Do not lay down for 30 minutes after taking. Be cautious with sun exposure and use good sun protection while on this medication. Pregnant women should not take  this medication.     LSC, chronic, improves with vtama  but flares off it, not at patient goal Exam: Well-demarcated scaly plaque on left extensor elbow. No lesions on right side Not at goal   Psoriasis is a chronic non-curable, but treatable genetic/hereditary disease that may have other systemic features affecting other organ systems such as joints (Psoriatic Arthritis). It is associated with an increased risk of inflammatory bowel disease, heart disease, non-alcoholic fatty liver disease, and depression.  Treatments include light and laser treatments; topical medications; and systemic medications including oral and injectables.   Treatment Plan: Continue Vtama  1% cream. Apply to aa, elbow daily.  More samples of Vtama  x3 given to patient today.  Lot  Exp   SEBORRHEIC KERATOSIS and acrochordon on neck Continue otc Hydrocortisone  prn for itchy areas only  - Stuck-on, waxy, tan-brown papules and/or plaques  - Benign-appearing - Discussed benign etiology and prognosis. - Observe - Call for any changes   ACNE VULGARIS, chronic, improved with treatment, at patient goal Exam: no active lesions. 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  ACNE VULGARIS   Related Medications tretinoin  (RETIN-A ) 0.1 % cream Apply to face at bedtime for ACNE ROSACEA   LSC (LICHEN SIMPLEX CHRONICUS)   SEBORRHEIC KERATOSES   ACROCHORDON    Return in about 1 year (around 08/16/2025) for Acne, Rosacea, Psoriasis .  IFay Kirks, CMA, am acting as scribe for Boneta Sharps, MD .   Documentation: I have reviewed the above documentation for accuracy and completeness, and I agree with the above.  Boneta Sharps, MD

## 2024-08-22 DIAGNOSIS — M25461 Effusion, right knee: Secondary | ICD-10-CM | POA: Diagnosis not present

## 2024-08-22 DIAGNOSIS — M25561 Pain in right knee: Secondary | ICD-10-CM | POA: Diagnosis not present

## 2024-08-22 DIAGNOSIS — M1711 Unilateral primary osteoarthritis, right knee: Secondary | ICD-10-CM | POA: Diagnosis not present

## 2024-09-13 DIAGNOSIS — M1711 Unilateral primary osteoarthritis, right knee: Secondary | ICD-10-CM | POA: Diagnosis not present

## 2024-10-22 ENCOUNTER — Other Ambulatory Visit: Payer: Self-pay | Admitting: Surgery

## 2024-10-22 DIAGNOSIS — M1711 Unilateral primary osteoarthritis, right knee: Secondary | ICD-10-CM | POA: Diagnosis not present

## 2024-10-22 DIAGNOSIS — Z6841 Body Mass Index (BMI) 40.0 and over, adult: Secondary | ICD-10-CM | POA: Diagnosis not present

## 2024-10-22 DIAGNOSIS — S83231A Complex tear of medial meniscus, current injury, right knee, initial encounter: Secondary | ICD-10-CM

## 2024-10-27 ENCOUNTER — Ambulatory Visit
Admission: RE | Admit: 2024-10-27 | Discharge: 2024-10-27 | Disposition: A | Discharge status: Home / Self Care | Source: Ambulatory Visit | Attending: Surgery | Admitting: Surgery

## 2024-10-27 DIAGNOSIS — M94261 Chondromalacia, right knee: Secondary | ICD-10-CM | POA: Diagnosis not present

## 2024-10-27 DIAGNOSIS — S83231A Complex tear of medial meniscus, current injury, right knee, initial encounter: Secondary | ICD-10-CM | POA: Insufficient documentation

## 2024-10-27 DIAGNOSIS — M1711 Unilateral primary osteoarthritis, right knee: Secondary | ICD-10-CM | POA: Insufficient documentation

## 2024-11-03 DIAGNOSIS — F200 Paranoid schizophrenia: Secondary | ICD-10-CM | POA: Diagnosis not present

## 2024-12-01 DIAGNOSIS — J019 Acute sinusitis, unspecified: Secondary | ICD-10-CM | POA: Diagnosis not present

## 2024-12-06 ENCOUNTER — Ambulatory Visit: Admitting: Dermatology

## 2024-12-20 ENCOUNTER — Encounter: Payer: Self-pay | Admitting: Dermatology

## 2024-12-20 ENCOUNTER — Ambulatory Visit: Admitting: Dermatology

## 2024-12-20 DIAGNOSIS — L821 Other seborrheic keratosis: Secondary | ICD-10-CM

## 2024-12-20 DIAGNOSIS — L280 Lichen simplex chronicus: Secondary | ICD-10-CM | POA: Diagnosis not present

## 2024-12-20 DIAGNOSIS — L820 Inflamed seborrheic keratosis: Secondary | ICD-10-CM

## 2024-12-20 DIAGNOSIS — D2372 Other benign neoplasm of skin of left lower limb, including hip: Secondary | ICD-10-CM

## 2024-12-20 DIAGNOSIS — D239 Other benign neoplasm of skin, unspecified: Secondary | ICD-10-CM

## 2024-12-20 NOTE — Patient Instructions (Addendum)

## 2024-12-20 NOTE — Progress Notes (Signed)
" ° °  Follow-Up Visit   Subjective  Samantha Dean is a 68 y.o. female who presents for the following: check spot chest, has gotten larger, has tried HC 2.5% cr did not help, check spot L base of neck/shoulder, check bump L shoudler   The following portions of the chart were reviewed this encounter and updated as appropriate: medications, allergies, medical history  Review of Systems:  No other skin or systemic complaints except as noted in HPI or Assessment and Plan.  Objective  Well appearing patient in no apparent distress; mood and affect are within normal limits.   A focused examination was performed of the following areas: chest  Relevant exam findings are noted in the Assessment and Plan.  central chest x 3, L chest x 2 (5) Stuck on waxy paps with erythema  Assessment & Plan   DERMATOFIBROMA L shoulder Exam: Firm pink/brown papulenodule with dimple sign.  Treatment Plan: A dermatofibroma is a benign growth possibly related to trauma, such as an insect bite, cut from shaving, or inflamed acne-type bump.  Treatment options to remove include shave or excision with resulting scar and risk of recurrence.  Since benign-appearing and not bothersome, will observe for now.    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 Condition is symptomatic/ bothersome to patient. Not currently at goal.   Treatment Plan: Cont Vtama  qd prn flares, samples x 2, Lot [id number], 02/2027 Recommend arm sleeve with soft pad to reduce pressure  SEBORRHEIC KERATOSIS - Stuck-on, waxy, tan-brown papules and/or plaques  - Benign-appearing - Discussed benign etiology and prognosis. - Observe - Call for any changes INFLAMED SEBORRHEIC KERATOSIS (5) central chest x 3, L chest x 2 (5) Symptomatic, irritating, patient would like treated. - Destruction of lesion - central  chest x 3, L chest x 2 (5) Complexity: simple   Destruction method: cryotherapy   Informed consent: discussed and consent obtained   Timeout:  patient name, date of birth, surgical site, and procedure verified Lesion destroyed using liquid nitrogen: Yes   Region frozen until ice ball extended beyond lesion: Yes   Cryo cycles: 1 or 2. Outcome: patient tolerated procedure well with no complications   Post-procedure details: wound care instructions given    DERMATOFIBROMA   SEBORRHEIC KERATOSES   LSC (LICHEN SIMPLEX CHRONICUS)    Return for as scheduled for acne/Rosacea f/u.  I, Grayce Saunas, RMA, am acting as scribe for Boneta Sharps, MD .   Documentation: I have reviewed the above documentation for accuracy and completeness, and I agree with the above.  Boneta Sharps, MD    "

## 2025-08-16 ENCOUNTER — Ambulatory Visit: Admitting: Dermatology
